# Patient Record
Sex: Female | Born: 1990 | Race: White | Hispanic: No | Marital: Single | Smoking: Never smoker
Health system: Southern US, Academic
[De-identification: ages and names within clinical notes are randomized; demographics above are authoritative.]

## PROBLEM LIST (undated history)

## (undated) DIAGNOSIS — E119 Type 2 diabetes mellitus without complications: Secondary | ICD-10-CM

## (undated) DIAGNOSIS — J45909 Unspecified asthma, uncomplicated: Secondary | ICD-10-CM

## (undated) DIAGNOSIS — E039 Hypothyroidism, unspecified: Secondary | ICD-10-CM

## (undated) DIAGNOSIS — I10 Essential (primary) hypertension: Secondary | ICD-10-CM

## (undated) DIAGNOSIS — E282 Polycystic ovarian syndrome: Secondary | ICD-10-CM

## (undated) DIAGNOSIS — F99 Mental disorder, not otherwise specified: Secondary | ICD-10-CM

## (undated) DIAGNOSIS — I471 Supraventricular tachycardia: Secondary | ICD-10-CM

## (undated) DIAGNOSIS — D682 Hereditary deficiency of other clotting factors: Secondary | ICD-10-CM

## (undated) HISTORY — DX: Hereditary deficiency of other clotting factors (CMS HCC): D68.2

## (undated) HISTORY — DX: Type 2 diabetes mellitus without complications (CMS HCC): E11.9

## (undated) HISTORY — DX: Unspecified asthma, uncomplicated: J45.909

## (undated) HISTORY — PX: HX GALL BLADDER SURGERY/CHOLE: SHX55

## (undated) HISTORY — DX: Supraventricular tachycardia (CMS HCC): I47.1

## (undated) HISTORY — DX: Hypothyroidism, unspecified: E03.9

## (undated) HISTORY — DX: Essential (primary) hypertension: I10

## (undated) HISTORY — DX: Polycystic ovarian syndrome: E28.2

## (undated) HISTORY — DX: Mental disorder, not otherwise specified: F99

## (undated) HISTORY — PX: WISDOM TOOTH EXTRACTION: SHX21

## (undated) HISTORY — DX: Type 2 diabetes mellitus without complications: E11.9

---
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
## 2014-06-13 HISTORY — PX: CHOLECYSTECTOMY: SHX55

## 2020-05-22 ENCOUNTER — Other Ambulatory Visit: Payer: Self-pay

## 2020-05-22 ENCOUNTER — Emergency Department (HOSPITAL_COMMUNITY): Payer: Medicare (Managed Care)

## 2020-05-22 ENCOUNTER — Emergency Department
Admission: AC | Admit: 2020-05-22 | Discharge: 2020-05-22 | Disposition: A | Discharge status: Home / Self Care | Payer: BC Managed Care – PPO | Source: Ambulatory Visit | Attending: Internal Medicine | Admitting: Internal Medicine

## 2020-05-22 DIAGNOSIS — I10 Essential (primary) hypertension: Secondary | ICD-10-CM

## 2020-05-22 DIAGNOSIS — E785 Hyperlipidemia, unspecified: Secondary | ICD-10-CM

## 2020-05-22 DIAGNOSIS — R55 Syncope and collapse: Secondary | ICD-10-CM | POA: Insufficient documentation

## 2020-05-22 DIAGNOSIS — Z20822 Contact with and (suspected) exposure to covid-19: Secondary | ICD-10-CM | POA: Insufficient documentation

## 2020-05-22 DIAGNOSIS — R079 Chest pain, unspecified: Secondary | ICD-10-CM

## 2020-05-22 DIAGNOSIS — R000 Tachycardia, unspecified: Secondary | ICD-10-CM

## 2020-05-22 DIAGNOSIS — E119 Type 2 diabetes mellitus without complications: Secondary | ICD-10-CM | POA: Insufficient documentation

## 2020-05-22 DIAGNOSIS — I471 Supraventricular tachycardia: Secondary | ICD-10-CM | POA: Insufficient documentation

## 2020-05-22 LAB — CBC
HCT: 42.6 % (ref 34.8–46.0)
HGB: 14.3 g/dL (ref 11.5–16.0)
MCH: 30.6 pg (ref 26.0–32.0)
MCHC: 33.6 g/dL (ref 31.0–35.5)
MCV: 91.2 fL (ref 78.0–100.0)
MPV: 10.7 fL (ref 8.7–12.5)
PLATELETS: 305 10*3/uL (ref 150–400)
RBC: 4.67 10*6/uL (ref 3.85–5.22)
RDW-CV: 12.1 % (ref 11.5–15.5)
WBC: 11.3 10*3/uL — ABNORMAL HIGH (ref 3.7–11.0)

## 2020-05-22 LAB — COMPREHENSIVE METABOLIC PANEL, NON-FASTING
ALBUMIN: 4.4 g/dL (ref 3.5–5.0)
ALKALINE PHOSPHATASE: 75 U/L (ref 40–110)
ALT (SGPT): 31 U/L — ABNORMAL HIGH (ref 8–22)
ANION GAP: 8 mmol/L (ref 4–13)
AST (SGOT): 22 U/L (ref 8–45)
BILIRUBIN TOTAL: 0.6 mg/dL (ref 0.3–1.3)
BUN/CREA RATIO: 14 (ref 6–22)
BUN: 12 mg/dL (ref 8–25)
CALCIUM: 9.8 mg/dL (ref 8.5–10.0)
CHLORIDE: 104 mmol/L (ref 96–111)
CO2 TOTAL: 24 mmol/L (ref 22–30)
CREATININE: 0.84 mg/dL (ref 0.60–1.05)
GLUCOSE: 314 mg/dL — ABNORMAL HIGH (ref 65–125)
POTASSIUM: 4.2 mmol/L (ref 3.5–5.1)
PROTEIN TOTAL: 8 g/dL (ref 6.4–8.3)
SODIUM: 136 mmol/L (ref 136–145)

## 2020-05-22 LAB — URINALYSIS, MACROSCOPIC
BILIRUBIN: NOT DETECTED mg/dL
BLOOD: NOT DETECTED mg/dL
KETONES: NOT DETECTED mg/dL
LEUKOCYTES: NOT DETECTED WBCs/uL
NITRITE: NOT DETECTED
PH: 6 (ref 5.0–8.0)
PROTEIN: NOT DETECTED mg/dL
SPECIFIC GRAVITY: 1.027 (ref 1.005–1.030)
UROBILINOGEN: NOT DETECTED mg/dL

## 2020-05-22 LAB — DRUG SCREEN, WITH CONFIRMATION, URINE
AMPHETAMINES, URINE: NEGATIVE
BARBITURATES URINE: NEGATIVE
BENZODIAZEPINES URINE: NEGATIVE
BUPRENORPHINE URINE: NEGATIVE
CANNABINOIDS URINE: NEGATIVE
COCAINE METABOLITES URINE: NEGATIVE
CREATININE RANDOM URINE: 89 mg/dL (ref 50–100)
ECSTASY/MDMA URINE: NEGATIVE
FENTANYL, RANDOM URINE: NEGATIVE
METHADONE URINE: NEGATIVE
OPIATES URINE (LOW CUTOFF): NEGATIVE
OXYCODONE URINE: NEGATIVE

## 2020-05-22 LAB — RESPIRATORY VIRUS PANEL

## 2020-05-22 LAB — ECG 12 LEAD
Atrial Rate: 93 {beats}/min
Calculated P Axis: 54 degrees
Calculated R Axis: 36 degrees
Calculated T Axis: 41 degrees
PR Interval: 136 ms
QRS Duration: 80 ms
QT Interval: 346 ms
QTC Calculation: 430 ms
Ventricular rate: 93 {beats}/min

## 2020-05-22 LAB — PTT (PARTIAL THROMBOPLASTIN TIME): APTT: 36.7 s (ref 26.0–39.0)

## 2020-05-22 LAB — PT/INR
INR: 1 (ref <=5.00)
PROTHROMBIN TIME: 11.8 s (ref 9.7–13.6)

## 2020-05-22 LAB — LDL CHOLESTEROL, DIRECT: LDL DIRECT: 174 mg/dL — ABNORMAL HIGH (ref <100)

## 2020-05-22 LAB — ETHANOL, SERUM: ETHANOL: NOT DETECTED

## 2020-05-22 LAB — MAGNESIUM: MAGNESIUM: 1.8 mg/dL (ref 1.8–2.6)

## 2020-05-22 LAB — TROPONIN-I: TROPONIN I: 9 ng/L (ref 7–30)

## 2020-05-22 LAB — HCG, URINE QUALITATIVE, PREGNANCY: HCG URINE QUALITATIVE: NOT DETECTED

## 2020-05-22 LAB — LACTIC ACID LEVEL W/ REFLEX FOR LEVEL >2.0: LACTIC ACID: 2.2 mmol/L (ref 0.5–2.2)

## 2020-05-22 LAB — THYROID STIMULATING HORMONE (SENSITIVE TSH): TSH: 7.448 u[IU]/mL — ABNORMAL HIGH (ref 0.430–3.550)

## 2020-05-22 MED ORDER — SODIUM CHLORIDE 0.9 % IV BOLUS
1000.0000 mL | INJECTION | Freq: Once | Status: AC
Start: 2020-05-22 — End: 2020-05-22
  Administered 2020-05-22: 0 mL via INTRAVENOUS
  Administered 2020-05-22: 1000 mL via INTRAVENOUS

## 2020-05-22 MED ORDER — SODIUM CHLORIDE 0.9 % (FLUSH) INJECTION SYRINGE
3.0000 mL | INJECTION | Freq: Three times a day (TID) | INTRAMUSCULAR | Status: DC
Start: 2020-05-22 — End: 2020-05-22

## 2020-05-22 MED ORDER — SODIUM CHLORIDE 0.9 % (FLUSH) INJECTION SYRINGE
3.0000 mL | INJECTION | INTRAMUSCULAR | Status: DC | PRN
Start: 2020-05-22 — End: 2020-05-22

## 2020-05-22 NOTE — ED Nurses Note (Signed)
Patient went to the bathroom using a wheelchair. Patient back in the room and back on the monitor at this time.

## 2020-05-22 NOTE — ED Provider Notes (Signed)
Emergency Department  Provider Note  HPI - 05/22/2020    COVID-19 PANDEMIC IN EFFECT    History and Physical Exam     Marie Vasquez 29 y.o. female  Date of Birth: 05/31/1991     Attending: Dr. Victorino Dike Iisha Soyars  Scribe: Suella Grove    PCP: Merlene Morse, MD      HPI:    Visit Reason:   Chief Complaint   Patient presents with    Stroke Alert         History provided by: Patient and Co-workers    Marie Vasquez is a 29 y.o. female presents to the ED today for stroke like symptoms.     Patient arrives to the ED via private vehicle. Co-worker reports patient was startled when she came up to her to say hello and said "that is one way to break my SVT". Co-worker note patient verbalized she "really just needed to sit down" and when she sat down started starring off into space and became non-verbal, less responsive. Co-worker reports patient is a diabetic and her blood sugar was 289 and notes patient has a history of SVT. 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 Nurses note patient was a dead lift and is unable to ambulate at this time. Patient reports she is having chest pains but is unable to communicate anything else.     Patient has a history is pseudoseizures with anxiety.     Patient has not had any recent known sick exposures. Patient denies having any fever, head pain, neck pain, back pain, cough, SOB, abdominal pain, n/v/d, or GU symptoms. Patient does not have any other associated s/s at this time.    There are no other complaints or concerns noted at this time.    Location: Neuro  Quality: Stroke like symptoms  Onset: Today  Timing: Still present  Context: See HPI  Modifying factors: None noted  Associated symptoms: (+) Stroke like symptoms - starring off, awake but unresponsive, non-verbal, unable to ambulate, chest pain (-) fever, head pain, neck pain, back pain, cough, SOB, abdominal pain, n/v/d, or GU symptoms    Review of Systems:    Constitutional: (-) fever,  chills  Skin: (-) rashes, lesions  HENT: (-) sore throat, ear pain, difficulty swallowing  Eyes: (-) vision changes, redness, discharge  Cardio: (+) Chest pain, (-) palpitations   Respiratory: (-) cough, wheezing, SOB  GI:  (-) nausea, vomiting, diarrhea, constipation, abdominal pain  GU:  (-) dysuria, hematuria, polyuria  MSK: (-) joint pain, neck pain, back pain  Neuro: (+) Stroke like symptoms - starring off, awake but unresponsive, non-verbal, unable to ambulate. No numbness, tingling, headache  Psych: (-) SI, HI, anxiety, depression.  All other systems reviewed and are negative, unless commented on in the HPI.     Past Medical History     PMHx:    Medical History          No past medical history on file.       Allergies:    Not on File  Social History  Social History     Tobacco Use    Smoking status: Not on file   Substance Use Topics    Alcohol use: Not on file    Drug use: Not on file     Family History  Family Medical History:    None        Home Meds:     Current Facility-Administered Medications:  NS flush syringe, 3 mL, Intracatheter, Q8HRS, Joey Lierman, Alessandra Bevels, DO    NS flush syringe, 3 mL, Intracatheter, Q1H PRN, Kassim Guertin, Alessandra Bevels, DO  No current outpatient medications on file.          Exam and Objective Findings     Nursing notes reviewed. Old records reviewed.    Filed Vitals:    05/22/20 1515 05/22/20 1600 05/22/20 1730 05/22/20 1745   BP: 131/86 126/70 (!) 140/93 131/83   Pulse: 97 93 99 94   Resp: 18 20 19 18    Temp:             Physical Exam  Nursing note and vitals reviewed.  Vital signs reviewed as above.     Constitutional: Panicking, following commands. Pt is awake, alert, and in no acute distress.   Skin: Warm and dry. No rash or lesions.  HENT: Atraumatic. Moist mucous membranes. No pharyngeal erythema. Normal TM's.  Eyes: Conjunctivae are normal. Pupils are equal, round, and reactive to light.  Cardiovascular: Regular rate. Normal rhythm. Distal pulses intact bilaterally. No leg  swelling.  Respiratory: Breath sounds normal. Effort normal. No respiratory distress.   GI/abdomen: Abdomen is soft, nontender, nondistended. Bowel sounds are normal. No rebound, guarding, or masses.   MSK/Extremities: Normal range of motion. No tenderness or redness.   Neurological: Patient is alert and oriented to person, place and time. Cranial nerves 2-12 are grossly intact.  Psychiatric: Patient has a normal mood and affect.     Work-up:  Orders Placed This Encounter    ADULT ROUTINE BLOOD CULTURE, SET OF 2 ADULT BOTTLES (BACTERIA AND YEAST)    ADULT ROUTINE BLOOD CULTURE, SET OF 2 ADULT BOTTLES (BACTERIA AND YEAST)    RESPIRATORY VIRUS PANEL    CANCELED: CT ANGIO INTRACRANIAL W IV CONTRAST    CANCELED: CT ANGIO CAROTID-EXTRACRANIAL (NECK) W IV CONTRAST    CT BRAIN WO IV CONTRAST    MOBILE CHEST X-RAY    CBC    COMPREHENSIVE METABOLIC PANEL, NON-FASTING    PTT (PARTIAL THROMBOPLASTIN TIME)    PT/INR    LDL CHOLESTEROL, DIRECT    TROPONIN-I    URINALYSIS, MACROSCOPIC    DRUG SCREEN, WITH CONFIRMATION, URINE    THYROID STIMULATING HORMONE (SENSITIVE TSH)    HCG, URINE QUALITATIVE, PREGNANCY    LACTIC ACID LEVEL W/ REFLEX FOR LEVEL >2.0    ETHANOL, SERUM    MAGNESIUM    LACTIC ACID TIMED    ECG 12 LEAD    PERFORM POC WHOLE BLOOD GLUCOSE    PERFORM POC ISTAT CREATININE POINT OF CARE    INSERT & MAINTAIN PERIPHERAL IV ACCESS    PERIPHERAL IV DRESSING CHANGE    NS flush syringe    NS flush syringe    NS bolus infusion 1,000 mL        Labs:  Results for orders placed or performed during the hospital encounter of 05/22/20 (from the past 24 hour(s))   RESPIRATORY VIRUS PANEL    Specimen: Nasopharyngeal Swab   Result Value Ref Range    ADENOVIRUS ARRAY Not Detected Not Detected    CORONAVIRUS 229E Not Detected Not Detected    CORONAVIRUS HKU1 Not Detected Not Detected    CORONAVIRUS NL63 Not Detected Not Detected    CORONAVIRUS OC43 Not Detected Not Detected    SARS CORONAVIRUS 2 (SARS-CoV-2)  Not Detected Not Detected    METAPNEUMOVIRUS ARRAY Not Detected Not Detected    RHINOVIRUS/ENTEROVIRUS ARRAY Not Detected Not Detected  INFLUENZA A Not Detected Not Detected    INFLUENZA B ARRAY Not Detected Not Detected    PARAINFLUENZA 1 ARRAY Not Detected Not Detected    PARAINFLUENZA 2 ARRAY Not Detected Not Detected    PARAINFLUENZA 3 ARRAY Not Detected Not Detected    PARAINFLUENZA 4 ARRAY Not Detected Not Detected    RSV ARRAY Not Detected Not Detected    BORDETELLA PARAPERTUSSIS (IS 1001) Not Detected Not Detected    BORDETELLA PERTUSSIS ARRAY Not Detected Not Detected    CHLAMYDOPHILA PNEUMONIAE ARRAY Not Detected Not Detected    MYCOPLASMA PNEUMONIAE ARRAY Not Detected Not Detected   CBC   Result Value Ref Range    WBC 11.3 (H) 3.7 - 11.0 x103/uL    RBC 4.67 3.85 - 5.22 x106/uL    HGB 14.3 11.5 - 16.0 g/dL    HCT 90.3 00.9 - 23.3 %    MCV 91.2 78.0 - 100.0 fL    MCH 30.6 26.0 - 32.0 pg    MCHC 33.6 31.0 - 35.5 g/dL    RDW-CV 00.7 62.2 - 63.3 %    PLATELETS 305 150 - 400 x103/uL    MPV 10.7 8.7 - 12.5 fL   COMPREHENSIVE METABOLIC PANEL, NON-FASTING   Result Value Ref Range    SODIUM 136 136 - 145 mmol/L    POTASSIUM 4.2 3.5 - 5.1 mmol/L    CHLORIDE 104 96 - 111 mmol/L    CO2 TOTAL 24 22 - 30 mmol/L    ANION GAP 8 4 - 13 mmol/L    BUN 12 8 - 25 mg/dL    CREATININE 3.54 5.62 - 1.05 mg/dL    BUN/CREA RATIO 14 6 - 22    ESTIMATED GFR      ALBUMIN 4.4 3.5 - 5.0 g/dL     CALCIUM 9.8 8.5 - 56.3 mg/dL    GLUCOSE 893 (H) 65 - 125 mg/dL    ALKALINE PHOSPHATASE 75 40 - 110 U/L    ALT (SGPT) 31 (H) 8 - 22 U/L    AST (SGOT)  22 8 - 45 U/L    BILIRUBIN TOTAL 0.6 0.3 - 1.3 mg/dL    PROTEIN TOTAL 8.0 6.4 - 8.3 g/dL    Narrative    Hemolysis can alter results at this level (slight).  Hemolysis can alter results at this level (slight).   PTT (PARTIAL THROMBOPLASTIN TIME)   Result Value Ref Range    APTT 36.7 26.0 - 39.0 seconds   PT/INR   Result Value Ref Range    PROTHROMBIN TIME 11.8 9.7 - 13.6 seconds    INR 1.00  <=5.00   LDL CHOLESTEROL, DIRECT   Result Value Ref Range    LDL DIRECT 174 (H) <100 mg/dL   TROPONIN-I   Result Value Ref Range    TROPONIN I 9 7 - 30 ng/L   URINALYSIS, MACROSCOPIC   Result Value Ref Range    COLOR Yellow Colorless, Straw, Yellow    APPEARANCE Clear Clear    SPECIFIC GRAVITY 1.027 >1.005-<1.030    PH 6.0 >5.0-<8.0    PROTEIN Not Detected Not Detected mg/dL    GLUCOSE 3+ (A) Not Detected mg/dL    KETONES Not Detected Not Detected mg/dL    UROBILINOGEN Not Detected Not Detected mg/dL    BILIRUBIN Not Detected Not Detected mg/dL    BLOOD Not Detected Not Detected mg/dL    NITRITE Not Detected Not Detected    LEUKOCYTES Not Detected Not Detected WBCs/uL  DRUG SCREEN, WITH CONFIRMATION, URINE   Result Value Ref Range    AMPHETAMINES, URINE Negative Negative    BARBITURATES URINE Negative Negative    BENZODIAZEPINES URINE Negative Negative    BUPRENORPHINE URINE Negative Negative    CANNABINOIDS URINE Negative Negative    COCAINE METABOLITES URINE Negative Negative    METHADONE URINE Negative Negative    OPIATES URINE (LOW CUTOFF) Negative Negative    OXYCODONE URINE Negative Negative    ECSTASY/MDMA URINE Negative Negative    FENTANYL, RANDOM URINE Negative Negative    CREATININE RANDOM URINE 89 50 - 100 mg/dL   THYROID STIMULATING HORMONE (SENSITIVE TSH)   Result Value Ref Range    TSH 7.448 (H) 0.430 - 3.550 uIU/mL   HCG, URINE QUALITATIVE, PREGNANCY   Result Value Ref Range    HCG URINE QUALITATIVE Not detected Not detected   LACTIC ACID LEVEL W/ REFLEX FOR LEVEL >2.0   Result Value Ref Range    LACTIC ACID 2.2 0.5 - 2.2 mmol/L   ETHANOL, SERUM   Result Value Ref Range    ETHANOL None Detected    MAGNESIUM   Result Value Ref Range    MAGNESIUM 1.8 1.8 - 2.6 mg/dL    Narrative    Hemolysis can alter results at this level (slight).       Imaging:   Results for orders placed or performed during the hospital encounter of 05/22/20 (from the past 72 hour(s))   CT BRAIN WO IV CONTRAST     Status: None     Narrative    Exam:Lethargy and possible CVA    Clinical history:Lethargy with possible CVA in this diabetic patient with hyperglycemia.    Total DLP1107mG y*cm    This CT scanner is equipped with dose reducing technology which automatically reduces the MAS according to patient size to achieve the lowest radiation exposure possible.      Axial noncontrast 5 mm images were obtained through the brain and viewed with brain and bone windows. In addition, sagittal and coronal reconstructions were completed.    The ventricles are normal in size and position. There is no evidence of intracranial hemorrhage, midline shift, hydrocephalus, or acute ischemia in a major vascular territory. Bone windows demonstrate no calvarial fracture.      Impression    1. Normal noncontrast head CT.  2. No acute intracranial pathology.        Radiologist location ID: [id number]     MOBILE CHEST X-RAY     Status: None    Narrative    EXAMINATION: Chest AP portable    HISTORY: Altered mental status. Stroke alert.      AP portable view of the chest was obtained. Comparison made to an exam dated 03/08/2017 from [facility].    Heart size is normal. No acute pulmonary infiltrates are seen. There are no pleural effusions. No parenchymal masses are identified.      Impression    1. No evidence of acute cardiopulmonary disease.      Radiologist location ID: [id number]         Abnormal Lab results:  Labs Reviewed   CBC - Abnormal; Notable for the following components:       Result Value    WBC 11.3 (*)     All other components within normal limits   COMPREHENSIVE METABOLIC PANEL, NON-FASTING - Abnormal; Notable for the following components:    GLUCOSE 314 (*)     ALT (SGPT) 31 (*)  All other components within normal limits    Narrative:     Hemolysis can alter results at this level (slight).  Hemolysis can alter results at this level (slight).   LDL CHOLESTEROL, DIRECT - Abnormal; Notable for the following components:    LDL DIRECT 174 (*)      All other components within normal limits   URINALYSIS, MACROSCOPIC - Abnormal; Notable for the following components:    GLUCOSE 3+ (*)     All other components within normal limits   THYROID STIMULATING HORMONE (SENSITIVE TSH) - Abnormal; Notable for the following components:    TSH 7.448 (*)     All other components within normal limits   RESPIRATORY VIRUS PANEL - Normal   PTT (PARTIAL THROMBOPLASTIN TIME) - Normal   PT/INR - Normal   TROPONIN-I - Normal   DRUG SCREEN, WITH CONFIRMATION, URINE - Normal   HCG, URINE QUALITATIVE, PREGNANCY - Normal   LACTIC ACID LEVEL W/ REFLEX FOR LEVEL >2.0 - Normal   MAGNESIUM - Normal    Narrative:     Hemolysis can alter results at this level (slight).   ADULT ROUTINE BLOOD CULTURE, SET OF 2 BOTTLES (BACTERIA AND YEAST)   ADULT ROUTINE BLOOD CULTURE, SET OF 2 BOTTLES (BACTERIA AND YEAST)   ETHANOL, SERUM   LACTIC ACID TIMED   PERFORM POC WHOLE BLOOD GLUCOSE   PERFORM POC ISTAT CREATININE POINT OF CARE       EKG's     Most Recent EKG This Encounter   ECG 12 LEAD    Collection Time: 05/22/20  3:08 PM   Result Value    Ventricular rate 93    Atrial Rate 93    PR Interval 136    QRS Duration 80    QT Interval 346    QTC Calculation 430    Calculated P Axis 54    Calculated R Axis 36    Calculated T Axis 41    Narrative    Normal sinus rhythm  Normal ECG     REVIEWED BY DR. Yong ChannelJENNIFER Annalissa Murphey   @ 1508  No stemi  Confirmed by Yong ChannelAUXIER, Lily Kernen ([id number]), editor Suella GroveParsons, Mika [phone]) on 05/22/2020 5:40:55 PM     Assessment & Plan     Plan: Appropriate labs and imaging ordered. Medical Records reviewed.    MDM:   During the patient's stay in the emergency department, the above listed information was used to assist with medical decision making and were reviewed by myself when available for review.  ED Course     ED Course as of May 22 1816   Fri May 22, 2020   1814 Pt reports improvement.  She has a doctors ppt tomorrow and prefers to discuss titration of dm meds and synthroid with her PCP.     [JA]      ED Course User Index  [JA] Dexter Sauser, Alessandra BevelsJennifer M, DO       Patient remained stable throughout the emergency department course.      Consults:    Dr. Hunt OrisBokka at bedside    Disposition   Impression:   Diagnoses     Diagnosis Comment Added By Time Added    Tachycardia  Talmadge ChadAuxier, Khristopher Kapaun M, DO 05/22/2020  6:17 PM    Syncope  Harvin Konicek, Alessandra BevelsJennifer M, DO 05/22/2020  6:17 PM        Disposition:  Discharged     It was advised that the patient return to the ED with any new, concerning  or worsening symptoms and follow up as directed.    The patient verbalized understanding of all instructions and had no further questions or concerns.     Follow up:   Merlene Morse, MD  [address]  [address]  [place] [address]  [phone]    Schedule an appointment as soon as possible for a visit         I am scribing for, and in the presence of, Dr. Yong Channel for services provided on 05/22/2020.  Suella Grove, SCRIBE     Suella Grove, SCRIBE  05/22/2020, 14:56        I personally performed the services described in this documentation, as scribed  in my presence, and it is both accurate  and complete.    Alessandra Bevels Mariateresa Batra, DO  Alessandra Bevels Leith Hedlund, DO  05/25/20

## 2020-05-22 NOTE — Consults (Signed)
[facility]  Neurology Consult Note    05/22/2020       Name:  Marie Vasquez, Marie Vasquez  Age: 29 y.o.  Gender: female  Date of Admission:  05/22/2020  Date of Birth:  [birth year]    Code Status:   Code Status Information     Code Status Advance Care Planning    Not on file Jump to the Activity           PCP: No primary care provider on file.  Consulting physician:     Chief Complaint:  Unresponsiveness  Reason for the consult:  Stroke alert  History obtained from:  Patient on her sister    Last known normal:  2:30 p.m. on 05/22/2020  IV thrombolysis:  Not done due to low NIHSS  NIHSS:  0    Marie Vasquez is a 29 y.o., Data Unavailable female with PMH of factor deficiency, type 2 diabetes mellitus, hypertension, hyperlipidemia, PNES and supraventricular tachycardia brought in for the evaluation of unresponsiveness.  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  Patient reports that she has having symptoms of palpitations, mild shortness of breath and lightheadedness for which she went to break room where she was noted to have slumped over for which stroke alert was called.  On my evaluation patient is alert, awake and slow to respond but answering all of my questions without any focal neurological deficits.  Patient does have similar episodes of unresponsiveness with underlying event of supraventricular tachycardia in the past for which she takes extra dose of atenolol.  Patient does have staring episodes for which she was evaluated in epilepsy monitoring unit and was diagnosed with pseudoseizures.  She denies any focal weakness, numbness, slurred speech, facial droop, vision problems.  Patient does take aspirin at home.  Patient lives independently and does not need any assistance for ADLs.    Review of Systems:  Constitutional: Denies weight loss, fever and chills.  Eyes: Denies any changes in vision  HENT: Denies changes in hearing and ear  discharge  Respiratory: Denies SOB and cough  CV: Denies palpitations and CP.  GI: Denies abdominal pain, nausea, vomiting and diarrhea  GU: Denies dysuria and urinary frequency  MSK: Denies myalgia and joint pain  Skin: Denies rash and pruritus.  Neurological: As per HPI  Psychiatric: Denies recent changes in mood. Denies anxiety and depression.    Past Medical History:  Type 2 diabetes mellitus, supraventricular tachycardia, factor father home, hypertension, hyperlipidemia      Past Surgical History:  None  No past surgical history on file.    Past Social Hisory:  Drinks socially        Past Family History:  Father had factor deficiency  Family Medical History:    None         Allergies:  None  Not on File     Medications:  Takes aspirin at home  .  NS bolus infusion 1,000 mL 1,000 mL Once  .  NS flush syringe 3 mL Q8HRS      Vital Signs:  Filed Vitals:    05/22/20 1453 05/22/20 1454   BP:  (!) 152/91   Pulse: (!) 122 (!) 117   Resp: (!) 23 (!) 21        Physical exam:   General: No acute distress. Well-nourished.  Eyes: EOMI, no nystagmus  Lungs: No labored breathing, no wheezing  Cardiovascular: Regular radial pulses felt  Abdomen:  Soft, non-tender and non-distended.   Extremities: No edema. Non-tender.  Skin: No rashes or lesions.  Psychiatric: Cooperative. Appropriate mood and affect.    Neurological:  Mental status: Alert, awake, oriented X3, attentive, intact memory, good fund of knowledge, clear speech  Cranial nerves: Visual fields full, EOMI, PERRLA, intact facial sensations, face symmetric, intact hearing, intact gag, tongue midline, normal shoulder shrug.  Motor: Normal tone, 5/5 strength in all extremities.  Sensory: Intact sensation to pain/temperature and light touch  Reflexes: 2+ throughout. Plantar reflexes downgoing bilaterally.  Coordination: No dysmetria on FNT  Gait: Deferred to due to medical condition.      Assessment and Recommendations:    Marie Vasquez is a 29 y.o., Data  Unavailable female with PMH of factor deficiency, type 2 diabetes mellitus, hypertension, hyperlipidemia, PNES and supraventricular tachycardia brought in for the evaluation of unresponsiveness.    Labs: I personally reviewed all the lab results.       Neuroimaging: I personally reviewed the images and interpretated the results.    CT head 05/22/2020:  There is no acute intracranial findings noticed.    1. Unresponsiveness-resolved  Likely due to underlying supraventricular tachycardia versus anxiety.  Patient does not have any focal neurologic deficits on my exam and is less likely vascular event of the brain.  I would recommend checking for EKG, blood sugar and serum electrolytes.  IV normal saline 1 L bolus.  No further workup needed from Neurology standpoint.    I have discussed about my recommendations and plan with the patient.  She verbalized understanding to all of her questions were answered.    Thanks for the consult.  Call us with questions.        Judie Petit, MD  05/22/2020, 15:08     This note was partially generated using MModal Fluency Direct system, and there may be some incorrect words, spellings, and punctuation.

## 2020-05-22 NOTE — ED Triage Notes (Signed)
Patient arrived as a stroke alert. Patient is a Engineer, civil (consulting) on the floor. Coworkers report that she had told them she was not feeling well. Next time seen was altered. Hx of diabetes and SVT. BS 269

## 2020-05-22 NOTE — Ancillary Notes (Signed)
Stroke alert paged at 1444

## 2020-05-22 NOTE — ED Nurses Note (Signed)
Patient awake alert and oriented. Provider notified

## 2020-05-23 ENCOUNTER — Encounter (HOSPITAL_COMMUNITY): Payer: Self-pay

## 2020-05-23 ENCOUNTER — Other Ambulatory Visit: Payer: Self-pay

## 2020-05-23 ENCOUNTER — Emergency Department (HOSPITAL_COMMUNITY): Payer: Auto Insurance (includes no fault)

## 2020-05-23 ENCOUNTER — Emergency Department
Admission: EM | Admit: 2020-05-23 | Discharge: 2020-05-23 | Disposition: A | Discharge status: Home / Self Care | Payer: Auto Insurance (includes no fault) | Attending: Emergency Medicine | Admitting: Emergency Medicine

## 2020-05-23 ENCOUNTER — Emergency Department: Payer: Auto Insurance (includes no fault) | Attending: Emergency Medicine

## 2020-05-23 DIAGNOSIS — M25512 Pain in left shoulder: Secondary | ICD-10-CM | POA: Insufficient documentation

## 2020-05-23 DIAGNOSIS — R202 Paresthesia of skin: Secondary | ICD-10-CM | POA: Insufficient documentation

## 2020-05-23 DIAGNOSIS — Y92410 Unspecified street and highway as the place of occurrence of the external cause: Secondary | ICD-10-CM

## 2020-05-23 LAB — COMPREHENSIVE METABOLIC PANEL, NON-FASTING
ALBUMIN: 4.1 g/dL (ref 3.5–5.0)
ALKALINE PHOSPHATASE: 67 U/L (ref 40–110)
ALT (SGPT): 28 U/L — ABNORMAL HIGH (ref 8–22)
ANION GAP: 8 mmol/L (ref 4–13)
AST (SGOT): 18 U/L (ref 8–45)
BILIRUBIN TOTAL: 0.5 mg/dL (ref 0.3–1.3)
BUN/CREA RATIO: 11 (ref 6–22)
BUN: 9 mg/dL (ref 8–25)
CALCIUM: 9.4 mg/dL (ref 8.5–10.0)
CHLORIDE: 105 mmol/L (ref 96–111)
CO2 TOTAL: 22 mmol/L (ref 22–30)
CREATININE: 0.8 mg/dL (ref 0.60–1.05)
ESTIMATED GFR: >90 mL/min/BSA (ref >=60)
GLUCOSE: 291 mg/dL — ABNORMAL HIGH (ref 65–125)
POTASSIUM: 4.3 mmol/L (ref 3.5–5.1)
PROTEIN TOTAL: 7.4 g/dL (ref 6.4–8.3)
SODIUM: 135 mmol/L — ABNORMAL LOW (ref 136–145)

## 2020-05-23 LAB — CBC WITH DIFF
BASOPHIL #: <0.1 10*3/uL (ref <=0.20)
BASOPHIL %: 1 %
EOSINOPHIL #: 0.22 10*3/uL (ref <=0.50)
EOSINOPHIL %: 2 %
HCT: 42 % (ref 34.8–46.0)
HGB: 14.2 g/dL (ref 11.5–16.0)
IMMATURE GRANULOCYTE #: <0.1 10*3/uL (ref <0.10)
IMMATURE GRANULOCYTE %: 0 % (ref 0–1)
LYMPHOCYTE #: 2.93 10*3/uL (ref 1.00–4.80)
LYMPHOCYTE %: 30 %
MCH: 30.9 pg (ref 26.0–32.0)
MCHC: 33.8 g/dL (ref 31.0–35.5)
MCV: 91.5 fL (ref 78.0–100.0)
MONOCYTE #: 0.6 10*3/uL (ref 0.20–1.10)
MONOCYTE %: 6 %
MPV: 10.6 fL (ref 8.7–12.5)
NEUTROPHIL #: 5.79 10*3/uL (ref 1.50–7.70)
NEUTROPHIL %: 61 %
PLATELETS: 293 10*3/uL (ref 150–400)
RBC: 4.59 10*6/uL (ref 3.85–5.22)
RDW-CV: 12.1 % (ref 11.5–15.5)
WBC: 9.6 10*3/uL (ref 3.7–11.0)

## 2020-05-23 MED ORDER — TRAMADOL 37.5 MG-ACETAMINOPHEN 325 MG TABLET
1.0000 | ORAL_TABLET | Freq: Four times a day (QID) | ORAL | 0 refills | Status: AC | PRN
Start: 2020-05-23 — End: 2020-05-28

## 2020-05-23 MED ORDER — KETOROLAC 30 MG/ML (1 ML) INJECTION SOLUTION
30.0000 mg | INTRAMUSCULAR | Status: AC
Start: 2020-05-23 — End: 2020-05-23
  Administered 2020-05-23: 30 mg via INTRAVENOUS
  Filled 2020-05-23: qty 1

## 2020-05-23 MED ORDER — ONDANSETRON HCL (PF) 4 MG/2 ML INJECTION SOLUTION
4.0000 mg | INTRAMUSCULAR | Status: AC
Start: 2020-05-23 — End: 2020-05-23
  Administered 2020-05-23: 4 mg via INTRAVENOUS
  Filled 2020-05-23: qty 2

## 2020-05-23 MED ORDER — SODIUM CHLORIDE 0.9 % (FLUSH) INJECTION SYRINGE
3.0000 mL | INJECTION | Freq: Three times a day (TID) | INTRAMUSCULAR | Status: DC
Start: 2020-05-23 — End: 2020-05-23

## 2020-05-23 MED ORDER — SODIUM CHLORIDE 0.9 % (FLUSH) INJECTION SYRINGE
3.0000 mL | INJECTION | INTRAMUSCULAR | Status: DC | PRN
Start: 2020-05-23 — End: 2020-05-23
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
## 2020-05-23 NOTE — Discharge Instructions (Signed)
Wear sling to unload you shoulder.  However, do range of motion 2 times a day to reduce the likelihood of frozen shoulder syndrome    Tramadol as necessary for pain no working or driving while taking tramadol    Return here any new or worsening symptoms

## 2020-05-27 LAB — ADULT ROUTINE BLOOD CULTURE, SET OF 2 BOTTLES (BACTERIA AND YEAST): BLOOD CULTURE, ROUTINE: NO GROWTH

## 2020-05-28 LAB — ADULT ROUTINE BLOOD CULTURE, SET OF 2 BOTTLES (BACTERIA AND YEAST): BLOOD CULTURE, ROUTINE: NO GROWTH

## 2020-06-24 ENCOUNTER — Ambulatory Visit: Payer: Medicare (Managed Care) | Attending: PREVENTIVE MEDICINE-OCCUPATIONAL MEDICINE

## 2020-06-24 ENCOUNTER — Ambulatory Visit (HOSPITAL_COMMUNITY): Payer: Self-pay

## 2020-06-24 DIAGNOSIS — Z20822 Contact with and (suspected) exposure to covid-19: Secondary | ICD-10-CM | POA: Insufficient documentation

## 2020-06-24 DIAGNOSIS — Z20828 Contact with and (suspected) exposure to other viral communicable diseases: Secondary | ICD-10-CM

## 2020-06-24 DIAGNOSIS — R509 Fever, unspecified: Secondary | ICD-10-CM

## 2020-06-24 LAB — COVID-19 ~~LOC~~ MOLECULAR LAB TESTING: SARS-COV-2: NOT DETECTED

## 2020-06-24 NOTE — Progress Notes (Signed)
COVID-19 SCREENING NOTE    Primary Care Provider: Merlene Morse, MD     Has the patient had recent travel to/from or exposure to someone with recent travel to a COVID-19 "hot spot"? 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 Yes  Has the patient had cough? Yes  Has the patient had shortness of breath? Yes  Additional symptoms? congestion     There is no problem list on file for this patient.        Occupation: Data Unavailable    Disposition:   SCREENING INDICATED.  Order to be placed. SCREENING SITE: [place] [facility].  Patient phone number and address confirmed: [phone], [address]  [place] [address].      Verbal consent obtained from the patient to be contacted with result.    Y[place] Pacella A. Melvyn Neth, RN

## 2020-06-24 NOTE — Patient Instructions (Signed)
Thank you for visiting our drive through Coronavirus testing site.     You or your family member is a Person Under Investigation (called a PUI). As a PUI you are considered contagious, until the swab comes back telling you/us that you do not have Covid-19.    Here is what you must do now:  . Return home, no stops to shop or social events;  . Stay at home in isolation;  . No visitors to your home;  . Call you provider if you are worse to ask for guidance.    Results:  . We anticipate your result will be available in 7 days.  . Results will be sent to [email or web address].  If you do not have access to [email or web address], please go here to sign-up: [email or web address]  . Someone from our team will contact you when your result is available.  . You will be given further instructions about how long to stay in isolation at that time.    For the most current information on Coronavirus (COVID-19), please check these sites:  [facility]:  [email or web address]    Centers for Disease Control:  https://www.cdc.gov/coronavirus/2019-nCoV/index.html

## 2020-06-25 ENCOUNTER — Other Ambulatory Visit: Payer: Self-pay

## 2020-08-31 ENCOUNTER — Emergency Department (EMERGENCY_DEPARTMENT_HOSPITAL): Payer: Medicare (Managed Care)

## 2020-08-31 ENCOUNTER — Inpatient Hospital Stay
Admission: EM | Admit: 2020-08-31 | Discharge: 2020-09-02 | DRG: 101 | Disposition: A | Discharge status: Home / Self Care | Payer: Medicare (Managed Care) | Attending: Neurology | Admitting: Neurology

## 2020-08-31 ENCOUNTER — Encounter (HOSPITAL_COMMUNITY): Payer: Self-pay

## 2020-08-31 ENCOUNTER — Emergency Department (EMERGENCY_DEPARTMENT_HOSPITAL): Payer: Medicare (Managed Care) | Admitting: Radiology

## 2020-08-31 ENCOUNTER — Other Ambulatory Visit: Payer: Self-pay

## 2020-08-31 DIAGNOSIS — R569 Unspecified convulsions: Principal | ICD-10-CM | POA: Diagnosis present

## 2020-08-31 DIAGNOSIS — F329 Major depressive disorder, single episode, unspecified: Secondary | ICD-10-CM | POA: Diagnosis present

## 2020-08-31 DIAGNOSIS — Z7984 Long term (current) use of oral hypoglycemic drugs: Secondary | ICD-10-CM

## 2020-08-31 DIAGNOSIS — R0602 Shortness of breath: Secondary | ICD-10-CM

## 2020-08-31 DIAGNOSIS — E039 Hypothyroidism, unspecified: Secondary | ICD-10-CM | POA: Diagnosis present

## 2020-08-31 DIAGNOSIS — E119 Type 2 diabetes mellitus without complications: Secondary | ICD-10-CM | POA: Diagnosis present

## 2020-08-31 DIAGNOSIS — Z79890 Hormone replacement therapy (postmenopausal): Secondary | ICD-10-CM

## 2020-08-31 DIAGNOSIS — R0902 Hypoxemia: Secondary | ICD-10-CM | POA: Diagnosis present

## 2020-08-31 DIAGNOSIS — R002 Palpitations: Secondary | ICD-10-CM | POA: Diagnosis present

## 2020-08-31 DIAGNOSIS — I10 Essential (primary) hypertension: Secondary | ICD-10-CM | POA: Diagnosis present

## 2020-08-31 DIAGNOSIS — E282 Polycystic ovarian syndrome: Secondary | ICD-10-CM | POA: Diagnosis present

## 2020-08-31 DIAGNOSIS — Z79899 Other long term (current) drug therapy: Secondary | ICD-10-CM

## 2020-08-31 DIAGNOSIS — R9431 Abnormal electrocardiogram [ECG] [EKG]: Secondary | ICD-10-CM

## 2020-08-31 DIAGNOSIS — F411 Generalized anxiety disorder: Secondary | ICD-10-CM | POA: Diagnosis present

## 2020-08-31 DIAGNOSIS — R0789 Other chest pain: Secondary | ICD-10-CM | POA: Diagnosis present

## 2020-08-31 DIAGNOSIS — J45909 Unspecified asthma, uncomplicated: Secondary | ICD-10-CM | POA: Diagnosis present

## 2020-08-31 DIAGNOSIS — I471 Supraventricular tachycardia: Secondary | ICD-10-CM | POA: Diagnosis present

## 2020-08-31 DIAGNOSIS — D682 Hereditary deficiency of other clotting factors: Secondary | ICD-10-CM | POA: Diagnosis present

## 2020-08-31 DIAGNOSIS — R000 Tachycardia, unspecified: Secondary | ICD-10-CM

## 2020-08-31 LAB — VENOUS BLOOD GAS/LACTATE/LYTES (NA/K/CA/CL/GLUC)
%FIO2 (VENOUS): 21 %
%FIO2 (VENOUS): 21 %
BASE DEFICIT: 0.3 mmol/L (ref <=3.0)
BASE DEFICIT: 2 mmol/L (ref <=3.0)
BICARBONATE (VENOUS): 24.3 mmol/L (ref 22.0–26.0)
BICARBONATE (VENOUS): 22.5 mmol/L (ref 22.0–26.0)
CHLORIDE: 102 mmol/L (ref 101–111)
CHLORIDE: 104 mmol/L (ref 101–111)
GLUCOSE: 230 mg/dL — ABNORMAL HIGH (ref 60–105)
GLUCOSE: 194 mg/dL — ABNORMAL HIGH (ref 60–105)
IONIZED CALCIUM: 1.17 mmol/L (ref 1.10–1.35)
IONIZED CALCIUM: 1.14 mmol/L (ref 1.10–1.35)
LACTATE: 1.9 mmol/L — ABNORMAL HIGH (ref 0.0–1.3)
LACTATE: 2 mmol/L — ABNORMAL HIGH (ref 0.0–1.3)
PCO2 (VENOUS): 30 mm/Hg — ABNORMAL LOW (ref 41.00–51.00)
PCO2 (VENOUS): 28 mm/Hg — ABNORMAL LOW (ref 41.00–51.00)
PH (VENOUS): 7.48 — ABNORMAL HIGH (ref 7.31–7.41)
PH (VENOUS): 7.47 — ABNORMAL HIGH (ref 7.31–7.41)
PO2 (VENOUS): 42 mm/Hg (ref 35.0–50.0)
PO2 (VENOUS): 31 mm/Hg — ABNORMAL LOW (ref 35.0–50.0)
SODIUM: 134 mmol/L — ABNORMAL LOW (ref 137–145)
SODIUM: 134 mmol/L — ABNORMAL LOW (ref 137–145)
WHOLE BLOOD POTASSIUM: 3.7 mmol/L (ref 3.5–4.6)
WHOLE BLOOD POTASSIUM: 3.5 mmol/L (ref 3.5–4.6)

## 2020-08-31 LAB — CBC WITH DIFF
BASOPHIL #: 0.04 10*3/uL (ref <=0.20)
BASOPHIL %: 0 %
EOSINOPHIL #: 0.22 10*3/uL (ref <=0.50)
EOSINOPHIL %: 2 %
HCT: 48.1 % — ABNORMAL HIGH (ref 34.8–46.0)
HGB: 15.6 g/dL (ref 11.5–16.0)
IMMATURE GRANULOCYTE #: <0.04 10*3/uL (ref <0.10)
IMMATURE GRANULOCYTE %: 0 % (ref 0–1)
LYMPHOCYTE #: 4.22 10*3/uL (ref 1.00–4.80)
LYMPHOCYTE %: 39 %
MCH: 30 pg (ref 26.0–32.0)
MCHC: 32.4 g/dL (ref 31.0–35.5)
MCV: 92.5 fL (ref 78.0–100.0)
MONOCYTE #: 0.64 10*3/uL (ref 0.20–1.10)
MONOCYTE %: 6 %
MPV: 10.3 fL (ref 8.7–12.5)
NEUTROPHIL #: 5.79 10*3/uL (ref 1.50–7.70)
NEUTROPHIL %: 53 %
PLATELETS: 310 10*3/uL (ref 150–400)
RBC: 5.2 10*6/uL (ref 3.85–5.22)
RDW-CV: 12.3 % (ref 11.5–15.5)
WBC: 10.9 10*3/uL (ref 3.7–11.0)

## 2020-08-31 LAB — PTT (PARTIAL THROMBOPLASTIN TIME): APTT: 35.9 seconds (ref 24.2–37.5)

## 2020-08-31 LAB — URINALYSIS, MACROSCOPIC
BILIRUBIN: NEGATIVE mg/dL
COLOR: NORMAL
GLUCOSE: >=500 mg/dL — AB
KETONES: NEGATIVE mg/dL
NITRITE: NEGATIVE
PH: 5 (ref 5.0–8.0)
PROTEIN: NEGATIVE mg/dL
SPECIFIC GRAVITY: 1.026 (ref 1.005–1.030)
UROBILINOGEN: NEGATIVE mg/dL

## 2020-08-31 LAB — URINALYSIS, MICROSCOPIC
RBCS: 17 /hpf — ABNORMAL HIGH (ref <6.0)
WBCS: 6 /hpf (ref <11.0)

## 2020-08-31 LAB — ECG 12 LEAD
Atrial Rate: 88 {beats}/min
Calculated P Axis: 28 degrees
Calculated R Axis: 31 degrees
Calculated T Axis: 23 degrees
PR Interval: 132 ms
QRS Duration: 78 ms
QT Interval: 358 ms
QTC Calculation: 433 ms
Ventricular rate: 88 {beats}/min

## 2020-08-31 LAB — POC BLOOD GLUCOSE (RESULTS)
GLUCOSE, POC: 128 mg/dl — ABNORMAL HIGH (ref 70–105)
GLUCOSE, POC: 134 mg/dl — ABNORMAL HIGH (ref 70–105)

## 2020-08-31 LAB — COVID-19 ~~LOC~~ MOLECULAR LAB TESTING
INFLUENZA VIRUS TYPE A: NOT DETECTED
INFLUENZA VIRUS TYPE B: NOT DETECTED
RESPIRATORY SYNCTIAL VIRUS (RSV): NOT DETECTED
SARS-CoV-2: NOT DETECTED

## 2020-08-31 LAB — HEPATIC FUNCTION PANEL
ALBUMIN: 4.7 g/dL (ref 3.5–5.0)
ALKALINE PHOSPHATASE: 82 U/L (ref 40–110)
ALT (SGPT): 31 U/L (ref <55)
AST (SGOT): 18 U/L (ref 8–41)
BILIRUBIN DIRECT: 0.2 mg/dL (ref <0.3)
BILIRUBIN TOTAL: 0.6 mg/dL (ref 0.3–1.3)
PROTEIN TOTAL: 8.3 g/dL — ABNORMAL HIGH (ref 6.0–7.9)

## 2020-08-31 LAB — BASIC METABOLIC PANEL
ANION GAP: 13 mmol/L (ref 4–13)
BUN/CREA RATIO: 16 (ref 6–22)
BUN: 13 mg/dL (ref 8–25)
CALCIUM: 9.8 mg/dL (ref 8.5–10.2)
CHLORIDE: 101 mmol/L (ref 96–111)
CO2 TOTAL: 21 mmol/L — ABNORMAL LOW (ref 22–32)
CREATININE: 0.81 mg/dL (ref 0.49–1.10)
ESTIMATED GFR: >60 mL/min/{1.73_m2} (ref >60)
GLUCOSE: 215 mg/dL — ABNORMAL HIGH (ref 65–125)
POTASSIUM: 3.6 mmol/L (ref 3.5–5.1)
SODIUM: 135 mmol/L — ABNORMAL LOW (ref 136–145)

## 2020-08-31 LAB — TROPONIN-I (FOR ED ONLY): TROPONIN I: 8 ng/L (ref 0–30)

## 2020-08-31 LAB — THYROID STIMULATING HORMONE WITH FREE T4 REFLEX: TSH: 21.417 u[IU]/mL — ABNORMAL HIGH (ref 0.350–5.000)

## 2020-08-31 LAB — PT/INR
INR: 0.93 (ref 0.80–1.20)
PROTHROMBIN TIME: 10.7 seconds (ref 9.1–13.9)

## 2020-08-31 LAB — THYROXINE, FREE (FREE T4): THYROXINE (T4), FREE: 0.82 ng/dL (ref 0.70–1.25)

## 2020-08-31 LAB — LIPASE: LIPASE: 38 U/L (ref 10–80)

## 2020-08-31 LAB — HCG, PLASMA OR SERUM QUANTITATIVE, PREGNANCY: HCG QUANTITATIVE PREGNANCY: <1 IU/L (ref <5)

## 2020-08-31 LAB — D-DIMER: D-DIMER: <200 ng/mL DDU (ref <=232)

## 2020-08-31 MED ORDER — LORAZEPAM 2 MG/ML INJECTION SOLUTION
INTRAMUSCULAR | Status: AC
Start: 2020-08-31 — End: 2020-09-01
  Administered 2020-08-31: 09:00:00 0 mg
  Filled 2020-08-31: qty 1

## 2020-08-31 MED ORDER — SODIUM CHLORIDE 0.9 % IV BOLUS
1000.0000 mL | INJECTION | Status: AC
Start: 2020-08-31 — End: 2020-08-31
  Administered 2020-08-31: 08:00:00 1000 mL via INTRAVENOUS
  Administered 2020-08-31: 08:00:00 0 mL via INTRAVENOUS

## 2020-08-31 MED ORDER — ESCITALOPRAM 20 MG TABLET
20.0000 mg | ORAL_TABLET | Freq: Every day | ORAL | Status: DC
Start: 2020-09-01 — End: 2020-08-31

## 2020-08-31 MED ORDER — ENOXAPARIN 40 MG/0.4 ML SUBCUTANEOUS SYRINGE
40.0000 mg | INJECTION | SUBCUTANEOUS | Status: DC
Start: 2020-08-31 — End: 2020-09-02
  Administered 2020-08-31 – 2020-09-01 (×2): 40 mg via SUBCUTANEOUS
  Filled 2020-08-31 (×2): qty 0.4

## 2020-08-31 MED ORDER — ESCITALOPRAM 20 MG TABLET
20.0000 mg | ORAL_TABLET | Freq: Every evening | ORAL | Status: DC
Start: 2020-08-31 — End: 2020-09-02
  Administered 2020-08-31 – 2020-09-01 (×2): 20 mg via ORAL
  Filled 2020-08-31 (×2): qty 1

## 2020-08-31 MED ORDER — SENNOSIDES 8.6 MG-DOCUSATE SODIUM 50 MG TABLET
1.0000 | ORAL_TABLET | Freq: Two times a day (BID) | ORAL | Status: DC | PRN
Start: 2020-08-31 — End: 2020-09-02

## 2020-08-31 MED ORDER — ACETAMINOPHEN 325 MG TABLET
650.0000 mg | ORAL_TABLET | ORAL | Status: DC | PRN
Start: 2020-08-31 — End: 2020-09-02
  Administered 2020-08-31: 650 mg via ORAL
  Filled 2020-08-31: qty 2

## 2020-08-31 MED ORDER — INSULIN LISPRO 100 UNIT/ML SUB-Q SSIP
0.0000 [IU] | INJECTION | Freq: Four times a day (QID) | SUBCUTANEOUS | Status: DC | PRN
Start: 2020-08-31 — End: 2020-09-02
  Administered 2020-09-01 (×3): 2 [IU] via SUBCUTANEOUS
  Filled 2020-08-31: qty 300

## 2020-08-31 NOTE — ED Nurses Note (Signed)
Report given to Verlon Au, RN on 10W.

## 2020-08-31 NOTE — ED Triage Notes (Signed)
Patient arrives to ED via private vehicle. 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   Patient was driving and noticed she was having palpitations.  Patient stated her watch was reading 190's for her heart rate.   Patient reports shortness of breath and dizziness.     Patient does have a history of SVT.       Patient took an extra dose of Atenolol this am when the incident was happening.

## 2020-08-31 NOTE — H&P (Signed)
[facility]     Neurology H&P     Ed, Rayson, 30 y.o. female  Date of Admission:  08/31/2020  Date of Birth:  06/17/1990    PCP: Leland Johns, MD    Information obtained from: patient  Chief Complaint:  Seizure    HER:[id number] Marie Vasquez is a 30 y.o., White female who presents as a transfer from [facility] ED for seizure like activity. She presented to [facility] for chest pain and palpitations with a known history of SVT. She is on atenolol and had taken this today. She took a total of 100 mg this AM and checked her pulse which felt quick. Her apple watch reported her heart rate to be 190. She was lightheaded and dizzy. Her pulse was normal in [place]. She then was undergoing testing when she had generalized twitching with a desaturation. She has been transferred to [facility] for spell capture and monitoring.     Of note, she was seen on 05/22/20 at [facility] by Neurology. 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 She was evaluated and was slow to respond but recovered and stroke was ruled out. There was concern for hypoglycemic event. 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 It appears she was started on Keppra in July 2020. She was seen by Neurology at [facility] for spell characterization however no spell was captured, she then had an unresponsive event prior to discharge. Per review of Neurology note dated 01/08/2019 patient reported to that provider that a spell had been captured before and she had been told it had no evidence of epilepsy.       Semiology     Pre-spell - Prior to the spell, the patient had palpitations, heart racing, dyspnea, nausea, mid-sternal chest pain, she stated all occurring while on her drive to work this morning. She tried to  abort what she thought was her SVT by taking additional atenolol but it did not help.     Spell-    Eyes Open     LOC/loss of bowel or bladder control/tongue bite - No    The spell lasted possibly more than 20 minutes - told she was apneic     Patient is amnestic of events, but was told that she had eye fluttering movements and was unresponsive to voice and commands while in the ED at her workplace     Post-spell- Following the spell, the patient felt foggy, drowsy, felt weak            Triggers: Denies any of the below, states that he has felt her best in recent months  1. sleep deprivation   2. recent medication change   3. substance or alcohol use  4. head trauma  5. Fever/ infection    Known history of seizures  Onset:  First seizure at the age of 9 in July of 2020 while her father was ill, he passed in Mid-August of 2020. 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     Past History  - No history of head trauma followed by loss of consciousness  - No history  of meningitis/encephalitis   - History of birth trauma:     Family history of epilepsy: No known history.         Admission Source:  Transfer from another hospital -  [facility]  Advance Directives:  None-Discussed  Hospice involvement prior to admission?  Not applicable    Location (of pain): Quality (character of pain) Severity (minimal, mild, severe, scale or 1-10) Duration (how long has pain/sx present) Timing (when does pain/sx occur)  Context (activity at/before onset) Modifying Factors (what makes pain/sx  Better/worse) Associate Sign/Sx (what accompanies main pain/sx)    Past Medical History:   Diagnosis Date    Asthma     Diabetes mellitus, type 2 (CMS HCC)     Factor II deficiency (CMS HCC)     HTN (hypertension)     Hypothyroidism     PCOS (polycystic ovarian syndrome)     SVT (supraventricular tachycardia) (CMS HCC)         Past Surgical History:   Procedure Laterality Date    HX CHOLECYSTECTOMY         Medications Prior to Admission       Prescriptions    albuterol sulfate (PROVENTIL) 2.5 mg /3 mL (0.083 %) Inhalation Solution for Nebulization    Take 2.5 mg by inhalation Every 6 hours as needed    Ascorbic Acid 500 mg Oral Tablet, Chewable    Take 1 Tablet by mouth    atenoloL (TENORMIN) 25 mg Oral Tablet    Take 12.5 mg by mouth    Blood Sugar Diagnostic Strip    1 Strip    Blood-Glucose Meter Kit    Use as directed Dx: E 11.9    cetirizine (ZYRTEC) 10 mg Oral Tablet    Take 10 mg by mouth    dulaglutide (TRULICITY) 4.13 KG/4.0 mL Subcutaneous Pen Injector    0.75 mg by Subcutaneous route Every 7 days    escitalopram oxalate (LEXAPRO) 20 mg Oral Tablet    Take 20 mg by mouth    fluticasone propionate (FLONASE) 50 mcg/actuation Nasal Spray, Suspension    2 Sprays by Nasal route    Insulin Needles, Disposable, 31 gauge x 5/16" Needle    Use as directed with Lantus daily    levothyroxine (SYNTHROID) 150 mcg Oral Tablet    Take 150 mcg by mouth    Melatonin 5 mg Oral Capsule    Take 1 Capsule by mouth    MetFORMIN (GLUCOPHAGE) 1,000 mg Oral Tablet    Take 1,000 mg by mouth    metoclopramide HCl (REGLAN) 5 mg Oral Tablet    Take 5 mg by mouth    ondansetron (ZOFRAN ODT) 4 mg Oral Tablet, Rapid Dissolve    Take 4 mg by mouth Every 8 hours    pantoprazole (PROTONIX) 40 mg Oral Tablet, Delayed Release (E.C.)    Take 40 mg by mouth    rosuvastatin (CRESTOR) 5 mg Oral Tablet    Take 5 mg by mouth    traZODone (DESYREL) 50 mg Oral Tablet    Take 25 mg by mouth          Allergies   Allergen Reactions    Amoxicillin-Pot Clavulanate Hives/ Urticaria, Itching and Rash    Pseudoephedrine Hcl Hives/ Urticaria     Social History     Tobacco Use    Smoking status: Never Smoker    Smokeless tobacco: Never Used   Substance Use Topics    Alcohol use: Not  Currently         ROS: Other than ROS in the HPI, all other systems were  negative.    Exam:  Temperature: 36.6 C (97.9 F)  Heart Rate: 97  BP (Non-Invasive): (!) 129/100  Respiratory Rate: (!) 21  SpO2: 92 %  General: appears in good health  HENT:Head atraumatic and normocephalic  Neck: No JVD or thyromegaly or lymphadenopathy  Carotids:Carotids normal without bruit  Lungs: Clear to auscultation bilaterally.   Cardiovascular: regular rate and rhythm  Abdomen: Soft, non-tender  Extremities: No cyanosis or edema  Ophthalomscopic: normal w/o hemorrhages, exudates, or papilledema  Mental status:  Level of Consciousness: alert  Orientations: Alert and oriented x 3  MemoryRegistration, Recall, and Following of commands is normal  AttentionsAttention and Concentration are normal  Knowledge: Good  Language: Normal  Speech: Normal  Cranial nerves:   CN2: Visual acuity and fields intact  CN 3,4,6: EOMI, PERRLA  CN 5Facial sensation intact  CN 7Face symmetrical  CN 8: Hearing grossly intact  CN 9,10: Palate symmetric and gag normal  CN 11: Sternocleidomastoid and Trapezius have normal strength.  CN 12: Tongue normal with no fasiculations or deviation  Gait, Coordination, and Reflexes:   Gait: Normal  Coordination: Coordination is normal without tremor    Muscle tone: WNL  Muscle exam  Arm Right Left Leg Right Left   Deltoid 5/5 5/5 Iliopsoas 5/5 5/5   Biceps 5/5 5/5 Quads 5/5 5/5   Triceps 5/5 5/5 Hamstrings 5/5 5/5   Wrist Extension 5/5 5/5 Ankle Dorsi Flexion 5/5 5/5   Wrist Flexion 5/5 5/5 Ankle Plantar Flexion 5/5 5/5   Interossei 5/5 5/5 Ankle Eversion 5/5 5/5   APB 5/5 5/5 Ankle Inversion 5/5 5/5       Reflexes   RJ BJ TJ KJ AJ Plantars Hoffman's   Right 2+ 2+ 2+ 2+ 2+ Downgoing Not present   Left 2+ 2+ 2+ 2+ 2+ Downgoing Not present     Sensory: Sensory exam in the upper and lower extremities is normal  Normal color, texture and turgor without significant lesions or rashes  Diabetes Monitors:  No ulcerations    Labs:    I have reviewed all lab results.  CBC with Diff (Last 24 Hours):     Recent Results last 24 hours     08/31/20  0710   WBC 10.9   HGB 15.6   HCT 48.1*   MCV 92.5   PLTCNT 310   PMNS 53   LYMPHO 39   MONOCYTES 6   EOSINO 2   BASOPHILS 0   0.04     BMP (Last 24 Hours):    Recent Results last 24 hours     08/31/20  0710 08/31/20  0852   SODIUM 134*   135* 134*   POTASSIUM 3.6  --    CHLORIDE 102   101 104   CO2 21*  --    BUN 13  --    CREATININE 0.81  --    CALCIUM 9.8  --    GLUCOSENF 215*  --      TSH: 21.417  Troponin: 8  HCG: <1  Lactate: 2.0     Ref. Range 08/31/2020 07:10   TOTAL PROTEIN Latest Ref Range: 6.0 - 7.9 g/dL 8.3 (H)   ALBUMIN Latest Ref Range: 3.5 - 5.0 g/dL 4.7   BILIRUBIN, TOTAL Latest Ref Range: 0.3 - 1.3 mg/dL 0.6   BILIRUBIN,CONJUGATED Latest Ref Range: <0.3 mg/dL 0.2  AST (SGOT) Latest Ref Range: 8 - 41 U/L 18   ALT (SGPT) Latest Ref Range: <55 U/L 31   ALKALINE PHOSPHATASE Latest Ref Range: 40 - 110 U/L 82   LIPASE Latest Ref Range: 10 - 80 U/L 38       Review of reports and notes reveal:   CT Brain WO: Dated 08/31/20: per radiology report:  IMPRESSION:  No specific abnormality identified.    Independent Interpretation of images or specimens:  CT Brain WO: Dated 08/31/20: per my review:  No hemorrhage. Gray white differentiation is intact. No acute intracranial abnormality.     Assessment/Plan:  Active Hospital Problems    Diagnosis    Seizure (CMS HCC)    Seizure-like activity (CMS HCC)       Seizure like activity  Etiology: unclear. Reported history of non-epileptic events however it is unclear if events have truly been captured in the past.   - Admit to neurology for further evaluation   - Seizure precautions, Aspiration precautions  - Telemetry, Neuro checks Q4, Vitals.  - Extended bedside vEEG (admit to bed with v.monitoring- 10W/10E/9E/SE tower)   - Will plan for spell capture this admission if possible  - CBC, BMP, Mg, Phos, LFTs, CK, Vitamin B1/6/12, folate - pending  - TSH - 21.417, Free T4 - 0.82  - MRI brain w/wo contrast to evaluated for any  structural lesions - pending  - SS/PT/OT - pending   - Please avoid epileptogenic medications like imipenem, Ultram ,Webutrin ,Quinolones  - Per [place] law: Patient was advised to not drive motorized or nonmotorized vehicle for 6 months since last seizure or unexplained loss of consciousness. Advised not to engage in unsupervised activity which can pose risk of personal injury including heavy machinery work, operating in heights, bathing in bathtub or swimming unsupervised.        DNR Status this admission:  Full Code  Palliative/Supportive Care consulted?  no  Hospice Consulted?  Not applicable    Current Comorbid Conditions - Neurology H&P  Compression of the Brain:  no  Coma (GCS less than 8): Coma -Not applicable  TIA not applicable  Encephalopathy:  no  Encephalitis-Not applicable  Seizure-Not applicable   Respiratory Failure/Other-Not applicable  No  Coagulopathy Not applicable  N/A  Fatigue/Debility  N/A    DVT/PE Prophylaxis: Enoxaparin      Ulyses Southward, MD   08/31/2020, 20:59  Neurology, PGY-1  [facility]  Pager: [phone]        I saw and examined the patient.  I reviewed the resident's note.  I agree with the findings and plan of care as documented in the resident's note.  Any exceptions/additions are edited/noted.    Gemma Payor, MD

## 2020-08-31 NOTE — H&P (Incomplete)
Marie Vasquez     Neurology H&P     Marie Vasquez, Marie Vasquez, 30 y.o. female  Date of Admission:  08/31/2020  Date of Birth:  [birth year]    PCP: Leland Johns, MD    Information obtained from: patient  Chief Complaint:  Seizure    [id number] Marie Vasquez is a 30 y.o., White female who presents as a transfer from [facility] ED for seizure like activity. She presented to [facility] for chest pain and palpitations with a known history of SVT. She is on atenolol and had taken this today. She took a total of 100 mg this AM and checked her pulse which felt quick. Her apple watch reported her heart rate to be 190. She was lightheaded and dizzy. Her pulse was normal in [place]. She then was undergoing testing when she had generalized twitching with a desaturation. She has been transferred to [facility] for spell capture and monitoring.     Per patient, semiology is described as ***    Of note, she was seen on 05/22/20 at [facility] by Neurology. 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 She was evaluated and was slow to respond but recovered and stroke was ruled out. There was concern for hypoglycemic event. 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 It appears she was started on Keppra in July 2020. She was seen by Neurology at [facility] for spell characterization however no spell was captured, she then had an unresponsive event prior to discharge. Per review of Neurology note dated 01/08/2019 patient reported to that provider that a spell had been captured before and she had been told it had no evidence of epilepsy. ***     Admission Source:  Transfer from another hospital -   [facility]  Advance Directives:  None-Discussed  Hospice involvement prior to  admission?  Not applicable    Location (of pain): Quality (character of pain) Severity (minimal, mild, severe, scale or 1-10) Duration (how long has pain/sx present) Timing (when does pain/sx occur)  Context (activity at/before onset) Modifying Factors (what makes pain/sx  Better/worse) Associate Sign/Sx (what accompanies main pain/sx)    Past Medical History:   Diagnosis Date   . Asthma    . Diabetes mellitus, type 2 (CMS HCC)    . Factor II deficiency (CMS HCC)    . HTN (hypertension)    . Hypothyroidism    . PCOS (polycystic ovarian syndrome)    . SVT (supraventricular tachycardia) (CMS HCC)        Past Surgical History:   Procedure Laterality Date   . HX CHOLECYSTECTOMY         Medications Prior to Admission     Prescriptions    albuterol sulfate (PROVENTIL) 2.5 mg /3 mL (0.083 %) Inhalation Solution for Nebulization    Take 2.5 mg by inhalation Every 6 hours as needed    Ascorbic Acid 500 mg Oral Tablet, Chewable    Take 1 Tablet by mouth    atenoloL (TENORMIN) 25 mg Oral Tablet    Take 12.5 mg by mouth    Blood Sugar Diagnostic Strip    1 Strip    Blood-Glucose Meter Kit    Use as directed Dx: E 11.9  cetirizine (ZYRTEC) 10 mg Oral Tablet    Take 10 mg by mouth    dulaglutide (TRULICITY) 9.19 TY/6.0 mL Subcutaneous Pen Injector    0.75 mg by Subcutaneous route Every 7 days    escitalopram oxalate (LEXAPRO) 20 mg Oral Tablet    Take 20 mg by mouth    fluticasone propionate (FLONASE) 50 mcg/actuation Nasal Spray, Suspension    2 Sprays by Nasal route    Insulin Needles, Disposable, 31 gauge x 5/16" Needle    Use as directed with Lantus daily    levothyroxine (SYNTHROID) 150 mcg Oral Tablet    Take 150 mcg by mouth    Melatonin 5 mg Oral Capsule    Take 1 Capsule by mouth    MetFORMIN (GLUCOPHAGE) 1,000 mg Oral Tablet    Take 1,000 mg by mouth    metoclopramide HCl (REGLAN) 5 mg Oral Tablet    Take 5 mg by mouth    ondansetron (ZOFRAN ODT) 4 mg Oral Tablet, Rapid Dissolve    Take 4 mg by mouth Every 8 hours     pantoprazole (PROTONIX) 40 mg Oral Tablet, Delayed Release (E.C.)    Take 40 mg by mouth    rosuvastatin (CRESTOR) 5 mg Oral Tablet    Take 5 mg by mouth    traZODone (DESYREL) 50 mg Oral Tablet    Take 25 mg by mouth        Allergies   Allergen Reactions   . Amoxicillin-Pot Clavulanate Hives/ Urticaria, Itching and Rash   . Pseudoephedrine Hcl Hives/ Urticaria     Social History     Tobacco Use   . Smoking status: Never Smoker   . Smokeless tobacco: Never Used   Substance Use Topics   . Alcohol use: Not Currently     Past Family History: ***      ROS: {Ros - complete:30496}    Exam:  Temperature: 36.6 C (97.9 F)  Heart Rate: 97  BP (Non-Invasive): (!) 129/100  Respiratory Rate: (!) 21  SpO2: 92 %  General: {GENERAL :20860}  HENT:{HENT:20859}  Neck: {Neck:20861}  Carotids:{EXAM[place] CAROTID NEUROLOGY:50023956::"Carotids normal without bruit"}  Lungs: {Lung:20862}  Cardiovascular: {Cardiovascular:20864}  Abdomen: {Gastrointestinal:20865}  Extremities: {Extremities:20866}  Ophthalomscopic: {Fundi:209}  Glasgow: {Glasgow Coma:26881}  Mental status:  Level of Consciousness: {Level of Consciousness:50023916::"alert"}  Orientations: {Orientation:50023908::"Alert and oriented x 3"}  Memory{MEMORY:50023910::"Registration, Recall, and Following of commands is normal"}  Attentions{ATTENTION:50023911::"Attention and Concentration are normal"}  Knowledge: {KNOWLEDGE:50023912::"Good"}  Language: {LANGUAGE:50023913::"Normal"}  Speech: {SPEECH:50023914::"Normal"}  Cranial nerves:   CN2: {CRANIAL NERVE II:50023918::"Visual acuity and fields intact"}  CN 3,4,6: {CRANIAL NERVEs 3,4,6:21816::"EOMI, PERRLA"}  CN 5{CRANIAL NERVE 5:50023927::"Facial sensation intact"}  CN 7{CRANIAL NERVE 7:50023930::"Face symmetrical"}  CN 8: {CRANIAL NERVE 8:50023932::"Hearing grossly intact"}  CN 9,10: {EXAM[place] NEUROLOGIC CRANIAL NERVE 9,10:50023934::"Palate symmetric and gag normal"}  CN 11: {EXAM[place] NEUROLOGIC CRANIAL NERVE  11:50023937::"Sternocleidomastoid and Trapezius have normal strength."}  CN 12: {CRANIAL NERVE 12:50023940::"Tongue normal with no fasiculations or deviation"}  Gait, Coordination, and Reflexes:   Gait: {Neurologic Gait:50023942::"Normal"}  Coordination: {Coordination:50023944::"Coordination is normal without tremor"}    Muscle tone: {TONE2:20506}  Muscle exam  Arm Right Left Leg Right Left   Deltoid 5/5 5/5 Iliopsoas 5/5 5/5   Biceps 5/5 5/5 Quads 5/5 5/5   Triceps 5/5 5/5 Hamstrings 5/5 5/5   Wrist Extension 5/5 5/5 Ankle Dorsi Flexion 5/5 5/5   Wrist Flexion 5/5 5/5 Ankle Plantar Flexion 5/5 5/5   Interossei 5/5 5/5 Ankle Eversion 5/5 5/5   APB 5/5 5/5 Ankle  Inversion 5/5 5/5       Reflexes   RJ BJ TJ KJ AJ Plantars Hoffman's   Right 2+ 2+ 2+ 2+ 2+ Downgoing Not present   Left 2+ 2+ 2+ 2+ 2+ Downgoing Not present     Sensory: {EXAM[place]U NEUROLOGIC SENSORY:21817::"Sensory exam in the upper and lower extremities is normal"}  {SKIN EXAM (AMB):21020500}  Diabetes Monitors:  {NEURODIAB:50021320}    Labs:    I have reviewed all lab results.  CBC with Diff (Last 24 Hours):    Recent Results last 24 hours     08/31/20  0710   WBC 10.9   HGB 15.6   HCT 48.1*   MCV 92.5   PLTCNT 310   PMNS 53   LYMPHO 39   MONOCYTES 6   EOSINO 2   BASOPHILS 0  0.04     BMP (Last 24 Hours):    Recent Results last 24 hours     08/31/20  0710 08/31/20  0852   SODIUM 134*  135* 134*   POTASSIUM 3.6  --    CHLORIDE 102  101 104   CO2 21*  --    BUN 13  --    CREATININE 0.81  --    CALCIUM 9.8  --    GLUCOSENF 215*  --      TSH: 21.417  Troponin: 8  HCG: <1  Lactate: 2.0     Ref. Range 08/31/2020 07:10   TOTAL PROTEIN Latest Ref Range: 6.0 - 7.9 g/dL 8.3 (H)   ALBUMIN Latest Ref Range: 3.5 - 5.0 g/dL 4.7   BILIRUBIN, TOTAL Latest Ref Range: 0.3 - 1.3 mg/dL 0.6   BILIRUBIN,CONJUGATED Latest Ref Range: <0.3 mg/dL 0.2   AST (SGOT) Latest Ref Range: 8 - 41 U/L 18   ALT (SGPT) Latest Ref Range: <55 U/L 31   ALKALINE PHOSPHATASE Latest Ref Range: 40 -  110 U/L 82   LIPASE Latest Ref Range: 10 - 80 U/L 38       Review of reports and notes reveal:   CT Brain WO: Dated 08/31/20: per radiology report:  IMPRESSION:  No specific abnormality identified.    Independent Interpretation of images or specimens:  CT Brain WO: Dated 08/31/20: per my review:  No hemorrhage. Gray white differentiation is intact. No acute intracranial abnormality.     Assessment/Plan:  Active Hospital Problems    Diagnosis   . Seizure (CMS [facility])       Seizure like activity  Etiology: unclear. Reported history of non-epileptic events however it is unclear if events have truly been captured in the past.   - Admit to neurology for further evaluation   - Seizure precautions, Aspiration precautions  - Telemetry, Neuro checks Q4, Vitals.  - Extended bedside vEEG (admit to bed with v.monitoring- 10W/10E/9E/SE tower)   - Will plan for spell capture this admission if possible  - CBC, BMP, Mg, Phos, TSH, LFTs, CK, Vitamin B1/6/12, folate ***  - MRI brain w/wo contrast to evaluated for any structural lesions ***  - SS/PT/OT    - Please avoid epileptogenic medications like imipenem, Ultram ,Webutrin ,Quinolones  - Per [place] law: Patient was advised to not drive motorized or nonmotorized vehicle for 6 months since last seizure or unexplained loss of consciousness. Advised not to engage in unsupervised activity which can pose risk of personal injury including heavy machinery work, operating in heights, bathing in bathtub or swimming unsupervised.        DNR Status this  admission:  Full Code  Palliative/Supportive Care consulted?  no  Hospice Consulted?  Not applicable    Current Comorbid Conditions - Neurology H&P  Compression of the Brain:  no  Coma (GCS less than 8): Coma -Not applicable  TIA not applicable  Encephalopathy:  no  Encephalitis-Not applicable  Seizure-Not applicable   Respiratory Failure/Other-Not applicable  No  Coagulopathy Not applicable  N/A  Fatigue/Debility N/A    DVT/PE Prophylaxis:  Enoxaparin    Emmit Pomfret, MD

## 2020-08-31 NOTE — ED Provider Notes (Signed)
[facility] Emergency Department          Chief Complaint:  Patient presents with     Chief Complaint   Patient presents with    Shortness of Breath    Tachycardia         HPI    Marie Vasquez, date of birth 09-21-1990, is a 30 y.o. female who presents to the Emergency Department with shortness of breath and tachycardia.  The patient reports that she has a history of SVT that she follows with a cardiologist.  She has a family history of ischemic myopathy.  The patient states that she was feeling palpitations last night which is not unusual for her.  She went to bed and awoke this morning continued to have PVCs and on her way to work felt her heart began to race.  She takes atenolol for her SVT and took another dose.  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  The patient states that on arrival she was feeling lightheaded and dizzy but that this has resolved.  She notes that her pulse feels as if it is back to normal and she is beginning to feel little bit better but continues to have some chest tightness.  She denies any numbness, tingling, weakness, fevers, or current shortness of breath.      Review of Systems   Constitutional: Positive for fatigue. Negative for activity change, appetite change, chills and fever.   HENT: Negative for congestion, sore throat and voice change.    Eyes: Negative for visual disturbance.   Respiratory: Positive for chest tightness. Negative for cough, shortness of breath and wheezing.    Cardiovascular: Positive for chest pain and palpitations. Negative for leg swelling.   Gastrointestinal: Negative for abdominal pain, constipation, diarrhea, nausea and vomiting.   Genitourinary: Negative for difficulty urinating, dysuria, hematuria and vaginal bleeding.   Musculoskeletal: Negative for arthralgias, neck pain and neck stiffness.   Skin: Negative for rash and wound.    Neurological: Positive for dizziness and light-headedness. Negative for syncope, facial asymmetry, weakness and numbness.   Psychiatric/Behavioral: Negative for agitation.   All other systems reviewed and are negative.        Physical Exam  Vitals and nursing note reviewed.   Constitutional:       General: She is not in acute distress.     Appearance: Normal appearance. She is well-developed. She is obese. She is not ill-appearing or diaphoretic.   HENT:      Head: Normocephalic and atraumatic.      Right Ear: External ear normal.      Left Ear: External ear normal.      Nose: Nose normal. No congestion or rhinorrhea.      Mouth/Throat:      Mouth: Mucous membranes are moist.      Pharynx: Oropharynx is clear. No oropharyngeal exudate.   Eyes:      General: No scleral icterus.     Extraocular Movements: Extraocular movements intact.      Conjunctiva/sclera: Conjunctivae normal.      Pupils: Pupils are equal, round, and reactive to light.   Cardiovascular:      Rate and Rhythm: Normal rate and regular rhythm.      Pulses: Normal pulses.      Heart sounds: Normal heart sounds. No murmur heard.    No friction rub.  No gallop.   Pulmonary:      Effort: Pulmonary effort is normal. No respiratory distress.      Breath sounds: Normal breath sounds. No stridor. No wheezing, rhonchi or rales.   Abdominal:      General: Abdomen is flat. Bowel sounds are normal. There is no distension.      Palpations: Abdomen is soft.      Tenderness: There is no abdominal tenderness. There is no guarding or rebound.      Hernia: No hernia is present.   Musculoskeletal:         General: No swelling. Normal range of motion.      Cervical back: Normal range of motion and neck supple.   Skin:     General: Skin is warm and dry.      Capillary Refill: Capillary refill takes less than 2 seconds.      Coloration: Skin is not jaundiced.      Findings: No rash.   Neurological:      General: No focal deficit present.      Mental Status: She is alert  and oriented to person, place, and time. Mental status is at baseline.      Sensory: No sensory deficit.      Motor: No weakness.   Psychiatric:         Mood and Affect: Mood normal.         Behavior: Behavior normal.         Vitals:  Filed Vitals:    08/31/20 1626 08/31/20 1630 08/31/20 2000 08/31/20 2142   BP: (!) 141/99   117/80   Pulse: 73   64   Resp: 18   16   Temp: 36.8 C (98.2 F)   37.2 C (99 F)   SpO2:  93% 97%        Past Medical History:  Diagnosis     Past Medical History:   Diagnosis Date    Asthma     Diabetes mellitus, type 2 (CMS HCC)     Factor II deficiency (CMS HCC)     HTN (hypertension)     Hypothyroidism     PCOS (polycystic ovarian syndrome)     SVT (supraventricular tachycardia) (CMS HCC)        Past Surgical History:  Past Surgical History:   Procedure Laterality Date    Hx cholecystectomy         Family History: No family history on file.    Social History     Social History     Tobacco Use    Smoking status: Never Smoker    Smokeless tobacco: Never Used   Haematologist Use: Never used   Substance Use Topics    Alcohol use: Not Currently    Drug use: Never         Social History Main Topics     Social History     Substance and Sexual Activity   Drug Use Never           Allergies   Allergen Reactions    Amoxicillin-Pot Clavulanate Hives/ Urticaria, Itching and Rash    Pseudoephedrine Hcl Hives/ Urticaria           Vital Signs:  Pre-disposition vitals  Filed Vitals:    08/31/20 1626 08/31/20 1630 08/31/20 2000 08/31/20 2142   BP: (!) 141/99   117/80   Pulse: 73   64   Resp: 18   16  Temp: 36.8 C (98.2 F)   37.2 C (99 F)   SpO2:  93% 97%        Old records reviewed by me:  None       Diagnostics:    Labs:    Results for orders placed or performed during the hospital encounter of 08/31/20   BASIC METABOLIC PANEL   Result Value Ref Range    SODIUM 135 (L) 136 - 145 mmol/L    POTASSIUM 3.6 3.5 - 5.1 mmol/L    CHLORIDE 101 96 - 111 mmol/L    CO2 TOTAL 21 (L) 22 - 32  mmol/L    ANION GAP 13 4 - 13 mmol/L    CALCIUM 9.8 8.5 - 10.2 mg/dL    GLUCOSE 161 (H) 65 - 125 mg/dL    BUN 13 8 - 25 mg/dL    CREATININE 0.96 0.45 - 1.10 mg/dL    BUN/CREA RATIO 16 6 - 22    ESTIMATED GFR >60 >60 mL/min/1.13m2   D-DIMER   Result Value Ref Range    D-DIMER <200 <=232 ng/mL DDU   HCG, PLASMA OR SERUM QUANTITATIVE, PREGNANCY   Result Value Ref Range    HCG QUANTITATIVE PREGNANCY <1 <5 IU/L   HEPATIC FUNCTION PANEL   Result Value Ref Range    ALBUMIN 4.7 3.5 - 5.0 g/dL    ALKALINE PHOSPHATASE 82 40 - 110 U/L    ALT (SGPT) 31 <55 U/L    AST (SGOT) 18 8 - 41 U/L    BILIRUBIN TOTAL 0.6 0.3 - 1.3 mg/dL    BILIRUBIN DIRECT 0.2 <0.3 mg/dL    PROTEIN TOTAL 8.3 (H) 6.0 - 7.9 g/dL   LIPASE   Result Value Ref Range    LIPASE 38 10 - 80 U/L   PT/INR   Result Value Ref Range    PROTHROMBIN TIME 10.7 9.1 - 13.9 seconds    INR 0.93 0.80 - 1.20   PTT (PARTIAL THROMBOPLASTIN TIME)   Result Value Ref Range    APTT 35.9 24.2 - 37.5 seconds   TROPONIN-I (FOR ED ONLY)   Result Value Ref Range    TROPONIN I 8 0 - 30 ng/L   THYROID STIMULATING HORMONE W/ FREE T4 REFLEX   Result Value Ref Range    TSH 21.417 (H) 0.350 - 5.000 uIU/mL   VENOUS BLOOD GAS/LACTATE/LYTES (NA/K/CA/CL/GLUC)   Result Value Ref Range    %FIO2 (VENOUS) 21.0 %    PH (VENOUS) 7.48 (H) 7.31 - 7.41    PCO2 (VENOUS) 30.00 (L) 41.00 - 51.00 mm/Hg    PO2 (VENOUS) 42.0 35.0 - 50.0 mm/Hg    BASE DEFICIT 0.3 -3.0 - 3.0 mmol/L    BICARBONATE (VENOUS) 24.3 22.0 - 26.0 mmol/L    SODIUM 134 (L) 137 - 145 mmol/L    WHOLE BLOOD POTASSIUM 3.7 3.5 - 4.6 mmol/L    CHLORIDE 102 101 - 111 mmol/L    IONIZED CALCIUM 1.17 1.10 - 1.35 mmol/L    GLUCOSE 230 (H) 60 - 105 mg/dL    LACTATE 1.9 (H) 0.0 - 1.3 mmol/L   CBC WITH DIFF   Result Value Ref Range    WBC 10.9 3.7 - 11.0 x103/uL    RBC 5.20 3.85 - 5.22 x106/uL    HGB 15.6 11.5 - 16.0 g/dL    HCT 40.9 (H) 81.1 - 46.0 %    MCV 92.5 78.0 - 100.0 fL    MCH 30.0 26.0 - 32.0 pg  MCHC 32.4 31.0 - 35.5 g/dL    RDW-CV 59.4  58.5 - 15.5 %    PLATELETS 310 150 - 400 x103/uL    MPV 10.3 8.7 - 12.5 fL    NEUTROPHIL % 53 %    LYMPHOCYTE % 39 %    MONOCYTE % 6 %    EOSINOPHIL % 2 %    BASOPHIL % 0 %    NEUTROPHIL # 5.79 1.50 - 7.70 x103/uL    LYMPHOCYTE # 4.22 1.00 - 4.80 x103/uL    MONOCYTE # 0.64 0.20 - 1.10 x103/uL    EOSINOPHIL # 0.22 <=0.50 x103/uL    BASOPHIL # 0.04 <=0.20 x103/uL    IMMATURE GRANULOCYTE % 0 0 - 1 %    IMMATURE GRANULOCYTE # <0.04 <0.10 x103/uL   THYROXINE, FREE (FREE T4)   Result Value Ref Range    THYROXINE (T4), FREE 0.82 0.70 - 1.25 ng/dL   VENOUS BLOOD GAS/LACTATE/LYTES (NA/K/CA/CL/GLUC)   Result Value Ref Range    %FIO2 (VENOUS) 21.0 %    PH (VENOUS) 7.47 (H) 7.31 - 7.41    PCO2 (VENOUS) 28.00 (L) 41.00 - 51.00 mm/Hg    PO2 (VENOUS) 31.0 (L) 35.0 - 50.0 mm/Hg    BASE DEFICIT 2.0 -3.0 - 3.0 mmol/L    BICARBONATE (VENOUS) 22.5 22.0 - 26.0 mmol/L    SODIUM 134 (L) 137 - 145 mmol/L    WHOLE BLOOD POTASSIUM 3.5 3.5 - 4.6 mmol/L    CHLORIDE 104 101 - 111 mmol/L    IONIZED CALCIUM 1.14 1.10 - 1.35 mmol/L    GLUCOSE 194 (H) 60 - 105 mg/dL    LACTATE 2.0 (H) 0.0 - 1.3 mmol/L   COVID-19 SCREENING - Direct Admit/Transfer/ED Admit - NON-PUI   Result Value Ref Range    SARS-CoV-2 Not Detected Not Detected    INFLUENZA VIRUS TYPE A Not Detected Not Detected    INFLUENZA VIRUS TYPE B Not Detected Not Detected    RESPIRATORY SYNCTIAL VIRUS (RSV) Not Detected Not Detected   URINALYSIS, MACROSCOPIC   Result Value Ref Range    SPECIFIC GRAVITY 1.026 1.005 - 1.030    GLUCOSE >= 500 (A) Negative mg/dL    PROTEIN Negative Negative mg/dL    BILIRUBIN Negative Negative mg/dL    UROBILINOGEN Negative Negative mg/dL    PH 5.0 5.0 - 8.0    BLOOD Large (A) Negative mg/dL    KETONES Negative Negative mg/dL    NITRITE Negative Negative    LEUKOCYTES Trace (A) Negative WBCs/uL    APPEARANCE Cloudy (A) Clear    COLOR Normal (Yellow) Normal (Yellow)   URINALYSIS, MICROSCOPIC   Result Value Ref Range    WBCS 6.0 <11.0 /hpf    RBCS 17.0 (H)  <6.0 /hpf    BACTERIA Occasional or less Occasional or less /hpf    SQUAMOUS EPITHELIAL CELLS Several (A) Occasional or less /lpf    MUCOUS Light Light /lpf   ECG 12 LEAD   Result Value Ref Range    Ventricular rate 88 BPM    Atrial Rate 88 BPM    PR Interval 132 ms    QRS Duration 78 ms    QT Interval 358 ms    QTC Calculation 433 ms    Calculated P Axis 28 degrees    Calculated R Axis 31 degrees    Calculated T Axis 23 degrees   POC BLOOD GLUCOSE (RESULTS)   Result Value Ref Range    GLUCOSE, POC 128 (H) 70 -  105 mg/dl   POC BLOOD GLUCOSE (RESULTS)   Result Value Ref Range    GLUCOSE, POC 134 (H) 70 - 105 mg/dl     Labs reviewed and interpreted by me.    Radiology:    Results for orders placed or performed during the hospital encounter of 08/31/20   XR AP MOBILE CHEST     Status: None    Narrative    Elwyn NICOLE Bell  Female, 30 years old.    XR AP MOBILE CHEST performed on 08/31/2020 7:16 AM.    REASON FOR EXAM:  SOB    TECHNIQUE: 1 views/1 images submitted for interpretation.    COMPARISON:  05/23/2020    FINDINGS:  The heart is normal in size. No focal consolidation, pleural effusion, or pneumothorax is identified. Included portions of the upper abdomen are unremarkable.      Impression    No acute cardiopulmonary process.   CT BRAIN WO IV CONTRAST     Status: None    Narrative    Victoire NICOLE Akamine  Female, 30 years old.    CT BRAIN WO IV CONTRAST performed on 08/31/2020 9:15 AM.    REASON FOR EXAM:  New seizure    RADIATION DOSE: 1121.80 mGycm    COMPARISON: May 22, 2020    FINDINGS:  Soft tissues of the face and scalp appear unremarkable. Calvarium appears unremarkable. Paranasal sinuses are clear. There is no intracranial hemorrhage or mass effect or specific evidence for infarct.      Impression    No specific abnormality identified.       EKG:  12 lead EKG interpreted by me shows normal EKG, normal sinus rhythm      Procedures:  None    Orders:  Orders Placed This Encounter    URINE CULTURE     XR AP MOBILE CHEST    CT BRAIN WO IV CONTRAST    BASIC METABOLIC PANEL    CBC/DIFF    D-DIMER    HCG, PLASMA OR SERUM QUANTITATIVE, PREGNANCY    HEPATIC FUNCTION PANEL    LIPASE    PT/INR    PTT (PARTIAL THROMBOPLASTIN TIME)    TROPONIN-I (FOR ED ONLY)    THYROID STIMULATING HORMONE W/ FREE T4 REFLEX    VENOUS BLOOD GAS/LACTATE/LYTES (NA/K/CA/CL/GLUC)    CBC WITH DIFF    THYROXINE, FREE (FREE T4)    VENOUS BLOOD GAS/LACTATE/LYTES (NA/K/CA/CL/GLUC)    COVID-19 SCREENING - Direct Admit/Transfer/ED Admit - NON-PUI    AST (SGOT) - TOMORROW    ALT (SGPT) - TOMORROW    CREATINE KINASE, CK    URINALYSIS, MACROSCOPIC AND MICROSCOPIC W/CULTURE REFLEX    URINALYSIS, MACROSCOPIC    URINALYSIS, MICROSCOPIC    HGA1C (HEMOGLOBIN A1C WITH EST AVG GLUCOSE)    CBC/DIFF    BASIC METABOLIC PANEL, NON-FASTING    MAGNESIUM    PHOSPHORUS    CBC WITH DIFF    ECG 12 LEAD    ECG 12-LEAD    POCT WHOLE BLOOD GLUCOSE    EEG - CONTINUOUS VIDEO    PATIENT CLASS/LEVEL OF CARE DESIGNATION - RUBY    PATIENT CLASS/LEVEL OF CARE DESIGNATION    NS bolus infusion 1,000 mL    LORazepam (ATIVAN) 2 mg/mL injection ---Cabinet Override    enoxaparin PF (LOVENOX) 40 mg/0.4 mL SubQ injection    acetaminophen (TYLENOL) tablet    sennosides-docusate sodium (SENOKOT-S) 8.6-50mg  per tablet    SSIP insulin lispro 100 units/mL injection    escitalopram (  LEXAPRO) tablet       Encounter Diagnoses   Name Primary?    Seizure-like activity (CMS HCC) Yes    Shortness of breath          ED Course/Medical Decision Making:  Thurma Myrian Botello is a 30 y.o. female who presents to the Emergency Department with shortness of breath and chest pain. Initial evaluation showed the patient resting comfortably in bed and in no acute distress although she was having some active chest pain at the time of evaluation. Initial vital signs were within normal limits aside from some borderline hypertension.    Differential diagnosis included  but was not limited to SVT, other dysrhythmia, ACS, pneumonia, pneumothorax, and pulmonary embolism.  Given the patient's vital signs and history she was PERC negative and therefore pulmonary embolism seems unlikely. Given the patients symptoms and chief complaint labs and imaging were obtained. Labs showed CBC within normal limits, BMP within normal limits, and free T4 within normal limits, hepatic function panel within normal limits, troponin of 8, negative pregnancy test, urinalysis without UTI, and a VBG showing a lactate of 1.9 and a pH of 7.48.  The patient did appear to be anxious given her father's history of death from ischemic cardiomyopathy previously.  The patient is concerned that she may also have this.  While in the emergency department the patient appeared to have a seizure.  She was noted to be hypoxic on the monitor and upon evaluation had high twitching and was unresponsive.  She became hypoxic down to 60%.  The patient was bagged and did not appear to be initiating her own breaths at this time.  The patient was easily bagged and her seizure resolved spontaneously.  The patient did appear to have a postictal period of approximately 15 minutes where she seemed confused but was easily redirectable.  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  The patient states that she has a history of these but no and has been able to confirm that they are true seizures.  She notes that she has never been told that she got hypoxic during her spells previously.  Given the hypoxia I am concerned for to her seizure activity as well as her postictal period.  CT scan and chest x-ray were obtained following the seizure.  No acute intracranial hemorrhage or mass was noted on CT scan and chest x-ray returned with normal Carter pulmonary silhouette.  I discussed  care with Dr. Shawnie Dapper of Neurology who agreed to admit the patient for further workup and care of her seizures.  The patient was monitor the emergency department and remained hemodynamically stable without any further seizure-like activity.  The patient was then admitted for further workup and care.  No episodes of SVT were seen while the patient was in the emergency department but given her lightheadedness and dizziness with pulse in the 190s I was concerned that this could be a cause of her initial presentation.    The patient was in agreement with this plan. All further questions were answered to the patient's and mother's satisfaction.      Consults: None      Disposition: Admitted           Current Discharge Medication List        Carlyon Prows, MD  09/01/2020, 00:19

## 2020-08-31 NOTE — Care Management Notes (Signed)
Dr. Traci Sermon connected with Dr. Shawnie Dapper and she accepted to Neurology floor with tele.  Patient presented with chest discomfort (history of SVT).  Patient had seizure and O2 sat dropped and eyes twitched.  Patient was bagged and received ativan.  Previously worked up in [place] for seizures and dx with non-epileptic seizures (don't believe episode was ever caught on EEG).  CT head negative.

## 2020-08-31 NOTE — ED Nurses Note (Signed)
Attempted to called report to 10W. RN to call back.

## 2020-08-31 NOTE — ED Nurses Note (Signed)
Report given to EMS at bedside. Patient left the ED via [facility] at this time.

## 2020-09-01 DIAGNOSIS — F411 Generalized anxiety disorder: Secondary | ICD-10-CM

## 2020-09-01 DIAGNOSIS — F329 Major depressive disorder, single episode, unspecified: Secondary | ICD-10-CM

## 2020-09-01 LAB — ECG 12-LEAD
Atrial Rate: 72 {beats}/min
Calculated P Axis: 29 degrees
Calculated R Axis: 28 degrees
Calculated T Axis: 28 degrees
PR Interval: 148 ms
QRS Duration: 82 ms
QT Interval: 368 ms
QTC Calculation: 402 ms
Ventricular rate: 72 {beats}/min

## 2020-09-01 LAB — POC BLOOD GLUCOSE (RESULTS)
GLUCOSE, POC: 160 mg/dl — ABNORMAL HIGH (ref 70–105)
GLUCOSE, POC: 157 mg/dl — ABNORMAL HIGH (ref 70–105)
GLUCOSE, POC: 124 mg/dl — ABNORMAL HIGH (ref 70–105)
GLUCOSE, POC: 119 mg/dl — ABNORMAL HIGH (ref 70–105)

## 2020-09-01 LAB — CBC WITH DIFF
BASOPHIL #: <0.1 10*3/uL (ref <=0.20)
BASOPHIL %: 1 %
EOSINOPHIL #: 0.23 10*3/uL (ref <=0.50)
EOSINOPHIL %: 3 %
HCT: 44.5 % (ref 34.8–46.0)
HGB: 14.5 g/dL (ref 11.5–16.0)
IMMATURE GRANULOCYTE #: <0.1 10*3/uL (ref <0.10)
IMMATURE GRANULOCYTE %: 0 % (ref 0–1)
LYMPHOCYTE #: 3.28 10*3/uL (ref 1.00–4.80)
LYMPHOCYTE %: 37 %
MCH: 30.4 pg (ref 26.0–32.0)
MCHC: 32.6 g/dL (ref 31.0–35.5)
MCV: 93.3 fL (ref 78.0–100.0)
MONOCYTE #: 0.59 10*3/uL (ref 0.20–1.10)
MONOCYTE %: 7 %
MPV: 10.5 fL (ref 8.7–12.5)
NEUTROPHIL #: 4.63 10*3/uL (ref 1.50–7.70)
NEUTROPHIL %: 52 %
PLATELETS: 275 10*3/uL (ref 150–400)
RBC: 4.77 10*6/uL (ref 3.85–5.22)
RDW-CV: 12.4 % (ref 11.5–15.5)
WBC: 8.8 10*3/uL (ref 3.7–11.0)

## 2020-09-01 LAB — HGA1C (HEMOGLOBIN A1C WITH EST AVG GLUCOSE)
ESTIMATED AVERAGE GLUCOSE: 163 mg/dL
HEMOGLOBIN A1C: 7.3 % — ABNORMAL HIGH (ref 4.0–5.6)

## 2020-09-01 LAB — BASIC METABOLIC PANEL
ANION GAP: 7 mmol/L (ref 4–13)
BUN/CREA RATIO: 14 (ref 6–22)
BUN: 10 mg/dL (ref 8–25)
CALCIUM: 8.6 mg/dL (ref 8.5–10.0)
CHLORIDE: 106 mmol/L (ref 96–111)
CO2 TOTAL: 23 mmol/L (ref 22–30)
CREATININE: 0.71 mg/dL (ref 0.60–1.05)
ESTIMATED GFR: >90 mL/min/BSA (ref >=60)
GLUCOSE: 167 mg/dL — ABNORMAL HIGH (ref 65–125)
POTASSIUM: 3.8 mmol/L (ref 3.5–5.1)
SODIUM: 136 mmol/L (ref 136–145)

## 2020-09-01 LAB — PHOSPHORUS: PHOSPHORUS: 2.9 mg/dL (ref 2.4–4.7)

## 2020-09-01 LAB — ALT (SGPT): ALT (SGPT): 25 U/L — ABNORMAL HIGH (ref 8–22)

## 2020-09-01 LAB — CREATINE KINASE (CK), TOTAL, SERUM: CREATINE KINASE: 95 U/L (ref 25–190)

## 2020-09-01 LAB — AST (SGOT): AST (SGOT): 17 U/L (ref 8–45)

## 2020-09-01 LAB — MAGNESIUM: MAGNESIUM: 1.9 mg/dL (ref 1.8–2.6)

## 2020-09-01 MED ORDER — LEVOTHYROXINE 150 MCG TABLET
150.0000 ug | ORAL_TABLET | Freq: Every morning | ORAL | Status: DC
Start: 2020-09-02 — End: 2020-09-02
  Administered 2020-09-02: 150 ug via ORAL
  Filled 2020-09-01: qty 1

## 2020-09-01 MED ORDER — CETIRIZINE 10 MG TABLET
10.0000 mg | ORAL_TABLET | Freq: Every day | ORAL | Status: DC
Start: 2020-09-02 — End: 2020-09-02
  Administered 2020-09-02: 10 mg via ORAL
  Filled 2020-09-01: qty 1

## 2020-09-01 MED ORDER — FAMOTIDINE 20 MG TABLET
20.0000 mg | ORAL_TABLET | Freq: Two times a day (BID) | ORAL | Status: DC
Start: 2020-09-01 — End: 2020-09-02
  Administered 2020-09-01: 20 mg via ORAL
  Administered 2020-09-01: 0 mg via ORAL
  Administered 2020-09-02: 20 mg via ORAL
  Filled 2020-09-01 (×2): qty 1

## 2020-09-01 NOTE — Progress Notes (Signed)
[facility]  Neurology Progress Note      Marie Vasquez, Marie Vasquez, 30 y.o. female  Date of Admission:  08/31/2020  Date of service: 09/01/2020  Date of Birth:  1991-06-03      Chief Complaint: Seizure like activity, desaturation  Pt's condition today: stable    Subjective: Ms. Byrd feels well this morning. She did not have further events overnight. She is curious if this could have been a cardiogenic syncope due to close nature with chest pain and tachypnea. Worth noting, she describes a chest tightness with tachypnea for a few minutes followed by dizziness, seeing spots, and then loss of consciousness.    Vital Signs:  Temp (24hrs) Max:37.2 C (99 F)      Systolic (24hrs), Avg:126 , Min:100 , Max:141     Diastolic (24hrs), Avg:89, Min:66, Max:101    Temp  Avg: 36.9 C (98.4 F)  Min: 36.7 C (98.1 F)  Max: 37.2 C (99 F)  MAP (Non-Invasive)  Avg: 100.9 mmHG  Min: 77 mmHG  Max: 113 mmHG  Pulse  Avg: 79.2  Min: 61  Max: 97  Resp  Avg: 17.7  Min: 11  Max: 31  SpO2  Avg: 95.8 %  Min: 92 %  Max: 100 %       Today's Physical Exam:  General:  alert  Mental status:  Alert and oriented x 3  Memory:  Registration, Recall, and Following of commands is normal  Attention:  Attention and Concentration are normal  Knowledge:  Good  Language and Speech:  Normal and Normal  Cranial nerves:   Cranial nerves 2-12 are normal  Muscle tone:  WNL  Motor strength:  Motor strength is normal throughout.  Sensory:  Sensory exam in the upper and lower extremities is normal  Gait:  unable to assess. Reason: Connected to EEG  Coordination:  Coordination is normal without tremor  Reflexes:  Reflexes are 2/2 throughout    Current Medications:  acetaminophen (TYLENOL) tablet, 650 mg, Oral, Q4H PRN  enoxaparin PF (LOVENOX) 40 mg/0.4 mL SubQ injection, 40 mg, Subcutaneous, Q24H  escitalopram (LEXAPRO) tablet, 20 mg, Oral, NIGHTLY  LORazepam (ATIVAN) 2 mg/mL injection ---Cabinet Override, , ,   sennosides-docusate sodium (SENOKOT-S)  8.6-50mg  per tablet, 1 Tablet, Oral, 2x/day PRN  SSIP insulin lispro 100 units/mL injection, 0-12 Units, Subcutaneous, 4x/day PRN        I/O:  I/O last 24 hours:      Intake/Output Summary (Last 24 hours) at 09/01/2020 0723  Last data filed at 08/31/2020 1626  Gross per 24 hour   Intake 1320 ml   Output --   Net 1320 ml     I/O current shift:  No intake/output data recorded.  Last BM: PTA    Labs  Please indicate ordered or reviewed)  Reviewed: I have reviewed all lab results.  CBC with Diff (Last 24 Hours):    Recent Results last 24 hours     09/01/20  0646   WBC 8.8   HGB 14.5   HCT 44.5   MCV 93.3   PLTCNT 275   PMNS 52   LYMPHO 37   MONOCYTES 7   EOSINO 3   BASOPHILS 1   <0.10     BMP (Last 24 Hours):    Recent Results last 24 hours     08/31/20  0852 09/01/20  0646   SODIUM 134* 136   POTASSIUM  --  3.8   CHLORIDE 104 106   CO2  --  23   BUN  --  10   CREATININE  --  0.71   CALCIUM  --  8.6   GLUCOSENF  --  167*      Ref. Range 08/31/2020 07:10   TOTAL PROTEIN Latest Ref Range: 6.0 - 7.9 g/dL 8.3 (H)   ALBUMIN Latest Ref Range: 3.5 - 5.0 g/dL 4.7   BILIRUBIN, TOTAL Latest Ref Range: 0.3 - 1.3 mg/dL 0.6   BILIRUBIN,CONJUGATED Latest Ref Range: <0.3 mg/dL 0.2   AST (SGOT) Latest Ref Range: 8 - 41 U/L 18   ALT (SGPT) Latest Ref Range: <55 U/L 31   ALKALINE PHOSPHATASE Latest Ref Range: 40 - 110 U/L 82   LIPASE Latest Ref Range: 10 - 80 U/L 38     Troponin: 8  Hgb A1c: 7.3    Review of reports and notes reveal:   CT Brain WO: Dated 08/31/20: per radiology report:  IMPRESSION:  No specific abnormality identified.    Independent Interpretation of images or specimens:  CT Brain WO: Dated 08/31/20: per my review:  No hemorrhage. Gray white differentiation is intact. No acute intracranial abnormality.     Patient/ Family Discussion: Updated patient at bedside    Assessment/Plan:  Active Hospital Problems    Diagnosis    Seizure (CMS [facility])    Seizure-like activity (CMS HCC)     Seizure like activity  Etiology: unclear.  Reported history of non-epileptic events however it is unclear if events have truly been captured in the past. Per patient spells have been captured in the past but were attributed to stress/anxiety related to her (at the time) ill father.  - Seizure precautions, Aspiration precautions  - Telemetry, Neuro checks Q4, Vitals.  - Continue vEEG with goal of spell capture   - Plan for 24 hours. If seizure free will remove in the AM  - TSH - 21.417, Free T4 - 0.82  - MRI brain w/wo contrast deferred for now due to semiology of spell  - Please avoid epileptogenic medications like imipenem, Ultram ,Webutrin ,Quinolones  - Per [place] law: Patient was advised to not drive motorized or nonmotorized vehicle for 6 months since last seizure or unexplained loss of consciousness. Advised not to engage in unsupervised activity which can pose risk of personal injury including heavy machinery work, operating in heights, bathing in bathtub or swimming unsupervised.      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 Continue SSIP         ___    DVT/PE Prophylaxis - Enoxaparin  Consults - none  Hardware (Lines, Drains, Foley, Tubes) - PIV  Diet - Diabetic and Cardiac  Activity - Up w/ assistance and Up in chair TID w/ all meals  Therapy - None    Labs/Diagnostic studies ordered: N/A      Tana Felts, MD  09/01/2020, 06:41  PGY-2 Neurology      I saw and examined the patient.  I reviewed the resident's note.  I agree with the findings and plan of care as documented in the resident's note.  Any exceptions/additions are edited/noted.    Georgina Peer, MD

## 2020-09-01 NOTE — Care Management Notes (Signed)
[facility]  Care Management Note    Patient Name: Marie Vasquez  Date of Birth: 1991/03/26  Sex: female  Date/Time of Admission: 08/31/2020  6:59 AM  Room/Bed: 28/A  Payor: Advertising copywriter / Plan: UNITED HEALTHCARE CHOICE PLUS / Product Type: Non Managed Care /    LOS: 1 day   Primary Care Providers:  Merlene Morse, MD, MD (General)    Admitting Diagnosis:  Seizure (CMS [facility]) [R56.9]  Seizure-like activity (CMS [facility]) [R56.9]    Assessment:      09/01/20 1128   Assessment Details   Assessment Type Admission   Date of Care Management Update 09/01/20   Date of Next DCP Update 09/04/20   Readmission   Is this a readmission? No   Insurance Information/Type   Insurance type Commercial   Employment/Financial   Patient has Prescription Coverage?  Yes        Name of Insurance Coverage for Medications Restaurant manager, fast food Concerns none   Living Environment   Select an age group to open "lives with" row.  Adult   Lives With alone   Living Arrangements house   Able to Return to Prior Arrangements yes   Care Management Plan   Discharge Planning Status initial meeting   Projected Discharge Date 09/04/20   Discharge plan discussed with: Patient   CM will evaluate for rehabilitation potential yes   Patient choice offered to patient/family no   Form for patient choice reviewed/signed and on chart no   Patient aware of possible cost for ambulance transport?  No   Discharge Needs Assessment   Equipment Currently Used at [facility] glucometer;nebulizer   Equipment Needed After Discharge none   Discharge Facility/Level of Care Needs Home (Patient/Family Member/other)(code 1)   Transportation Available car;family or friend will provide   Referral Information   Admission Type inpatient   Address Verified verified-no changes   Arrived From acute hospital, other   Acute Care Facility Other - See Comments  ([place])   ADVANCE DIRECTIVES   Does the Patient have an Advance Directive? No, Information Offered and  Refused   Patient Requests Assistance in Having Advance Directive Notarized. N/A   LAY CAREGIVER    Appointed Lay Caregiver? I Decline   30 year old female admitted from [place] with seizure. See H&P for PMH. This admission, patient ordered labs, imaging, neurological/vital sign checks, telemetry, vEEG. I introduced myself and explained role of care management. CCC contact information placed on patient white board.    Discharge Plan:  Home (Patient/Family Member/other) (code 1)  Patient lives alone in 2 story house with 2 STE ([address], [place], [place] [address]). Family able to provide assistance after discharge. Patient uses no HH and has DME at home (glucometer, nebulizer). Patient demographics, PCP, pharmacy, and medical/prescription drug coverage verified. Patient able to afford copays and manages medications. MPOA/Living Will declined. Laycaregiver declined. Anticipated discharge to home once medically cleared. Family to provide transportation at time of discharge.     The patient will continue to be evaluated for developing discharge needs.     Case Manager: Karma Lew, CLINICAL CARE COORDINATOR  Phone: [phone]

## 2020-09-01 NOTE — Procedures (Addendum)
Continuous Video EEG Report    Marie Vasquez - 30 y.o. female   Attending provider : Georgina Peer, MD    Indication: sPell    EEG # : [id number]  Acquisition : digitally acquired video EEG  Headset : international 10-20 system  Technician :  CH      Reporting period 2 : 09/01/20 @6 :48pm to 09/02/2020@9 :21am    INTERPRETATION    Normal awake, drowsy and asleep EEG. A normal EEG does not rule out epilepsy; however, there was no evidence of an underlying seizure tendency on this recording.     No patient events were marked.    Clinical correlation required.    09/04/2020, MD  PGY-2 Neurology  Pager [phone]      I personally reviewed the EEG with the resident and agree with findings noted above.   Any exceptions/additions are edited/noted.    #[id number], MD  Assistant Professor, [facility] medicine  Pediatric Neurology and Epilepsy       DESCRIPTION    Background  Posterior dominant rhythm :  9 Hz  Architecture : symmetric, continuous, well-organized  Frequency composition : Alpha admixed with beta  State : stage N2 sleep evidenced by sleep spindles    Paroxysmal Findings  None    Seizures and Spells  None    Activation procedures  Photic stimulation: not performed  Hyperventilation: not performed    ---------------------------------------------------------------------    Reporting period 1 : 08/31/20 @6 :48pm to 09/01/2020@6 .48pm    INTERPRETATION    Normal awake, drowsy and asleep EEG. A normal EEG does not rule out epilepsy ; however, there was no evidence of an underlying seizure tendency on this recording.     No patient events were marked.    Clinical correlation required.    , MD  09/01/2020        I personally reviewed the EEG with the resident and agree with findings noted above.   Any exceptions/additions are edited/noted.    Junious Silk, MD  Assistant Professor, [facility] medicine  Pediatric Neurology and Epilepsy        DESCRIPTION    Background  Posterior dominant rhythm :  9  Hz  Architecture : symmetric, continuous, well-organized  Frequency composition : Alpha admixed with beta  State : stage N2 sleep evidenced by sleep spindles    Paroxysmal Findings  None    Seizures and Spells  None    Activation procedures  Photic stimulation: not performed  Hyperventilation: not performed

## 2020-09-02 LAB — POC BLOOD GLUCOSE (RESULTS)
GLUCOSE, POC: 149 mg/dl — ABNORMAL HIGH (ref 70–105)
GLUCOSE, POC: 149 mg/dl — ABNORMAL HIGH (ref 70–105)

## 2020-09-02 LAB — URINE CULTURE
URINE CULTURE: 1000 — AB
URINE CULTURE: >100000

## 2020-09-02 NOTE — Discharge Summary (Signed)
[facility]  DISCHARGE SUMMARY    PATIENT NAME:  Marie Vasquez, Marie Vasquez  MRN:  [id number]  DOB:  Sep 07, 1990    ENCOUNTER DATE:  08/31/2020  INPATIENT ADMISSION DATE: 08/31/2020  DISCHARGE DATE:  09/02/2020    ATTENDING PHYSICIAN: Gemma Payor, MD  SERVICE: NEUROLOGY 1  PRIMARY CARE PHYSICIAN: Leland Johns, MD         LAY CAREGIVER:  ,  ,        PRIMARY DISCHARGE DIAGNOSIS:    Active Hospital Problems    Diagnosis Date Noted    Seizure (CMS [facility]) [R56.9] 08/31/2020    Seizure-like activity (CMS [facility]) [R56.9] 08/31/2020      Resolved Hospital Problems   No resolved problems to display.     There are no active non-hospital problems to display for this patient.       DISCHARGE MEDICATIONS:     Current Discharge Medication List        CONTINUE these medications - NO CHANGES were made during your visit.        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  Refills: 0     cetirizine 10 mg Tablet  Commonly known as: ZYRTEC   10 mg, Oral  Refills: 0     escitalopram oxalate 20 mg Tablet  Commonly known as: LEXAPRO   20 mg, Oral  Refills: 0     fluticasone propionate 50 mcg/actuation Spray, Suspension  Commonly known as: FLONASE   2 Sprays, Nasal  Refills: 0     Insulin Needles (Disposable) 31 gauge x 5/16" Needle   Use as directed with Lantus daily  Refills: 0     levothyroxine 150 mcg Tablet  Commonly known as: SYNTHROID   150 mcg, Oral  Refills: 0     Melatonin 5 mg Capsule   1 Capsule, Oral  Refills: 0     MetFORMIN 1,000 mg Tablet  Commonly known as: GLUCOPHAGE   1,000 mg, Oral  Refills: 0     metoclopramide HCl 5 mg Tablet  Commonly  known as: REGLAN   5 mg, Oral  Refills: 0     pantoprazole 40 mg Tablet, Delayed Release (E.C.)  Commonly known as: PROTONIX   40 mg, Oral  Refills: 0     rosuvastatin 5 mg Tablet  Commonly known as: CRESTOR   5 mg, Oral  Refills: 0     Trulicity 6.75 FF/6.3 mL Pen Injector  Generic drug: dulaglutide   0.75 mg, Subcutaneous, EVERY 7 DAYS  Refills: 0            STOP taking these medications.      ondansetron 4 mg Tablet, Rapid Dissolve  Commonly known as: ZOFRAN ODT     traZODone 50 mg Tablet  Commonly known as: DESYREL            Discharge med list refreshed?  YES  ALLERGIES:  Allergies   Allergen Reactions    Amoxicillin-Pot Clavulanate Hives/ Urticaria, Itching and Rash    Pseudoephedrine Hcl Hives/ Urticaria             HOSPITAL PROCEDURE(S):   Bedside Procedures:  No orders of the defined types were placed in this encounter.    Surgical       REASON FOR HOSPITALIZATION AND HOSPITAL COURSE     BRIEF HPI:  This is a 30 y.o., female admitted for seizure like activity. She had been driving to work when she became tachycardic with rate peaking in the 190's. She had taken her AM atenolol and took a second dose when HR reached 120's. She arrived to the [facility] ED and was taken inside. Tachycardia broke at that time. She then developed chest tightness and became tachypneic followed by dizziness then followed by seeing spots and loss of consciousness. She desaturated to the 60's briefly. Per ED staff she had "generalized twitching". Cardiac work-up at the outside facility was unremarkable so she was transferred for seizure evaluation. 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 She has then been seizure free without AED until this reported activity. She does describe an episode before where she closed her eyes and  stopped speaking however the patient does not feel this was a seizure/spell.    BRIEF HOSPITAL NARRATIVE:      Patient was admitted and connected to video EEG. Laboratory work-up was unremarkable. MRI was deferred due to described semiology being more concerning for cardiogenic etiology. EEG was normal awake and asleep without spell capture. This was stopped on the morning of discharge. She was NOT started on anti-seizures medications. [place] driving restrictions were explained as well as seizure safety (caution with bodies of water, ladders, and stairs). She verbalized understanding of [place] seizure restrictions. She was discharged home in stable condition with follow-up ordered.     TRANSITION/POST DISCHARGE CARE/PENDING TESTS/REFERRALS:   - Follow up with [facility] Neurology ordered for 3 months   - Will see Dr. 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         CONDITION ON DISCHARGE:  A. Ambulation: Full ambulation  B. Self-care Ability: Complete  C. Cognitive Status Alert and Oriented x 3  D. Code status at discharge:              LINES/DRAINS/WOUNDS AT DISCHARGE:   Patient Lines/Drains/Airways Status       Active Line / Dialysis Catheter / Dialysis Graft / Drain / Airway / Wound       None                    DISCHARGE DISPOSITION:  Home discharge      NEUROLOGY RISK FACTORS:  -None of the following conditions apply        DISCHARGE INSTRUCTIONS:   Follow-up Information       Neurology, [place] .    Specialty: Neurology  Contact information:  [address]  [place] [address]  [phone]  Additional information:  Your Health is our  Highest Priority* Valet Services are currently suspended due to COVID-19 restrictions at this [place] outpatient clinic. We  apologize for any inconvenience this may cause. Please ask an attendant for assistance if needed.                            DISCHARGE INSTRUCTION - IMPORTANT INFORMATION    GENERAL INFORMATION: Contact the neurology office for questions/concerns Monday-Friday 7 a.m. - 5 p.m. at [phone].     DISCHARGE INSTRUCTION - MISC    Resume your home medications. Please contact the [facility] neurology clinic at phone [phone] or Fax [phone] for Dr. Jeanne Ivan if you need paperwork for work.     FOLLOW-UP: [place], [place]     Follow-up in: OTHER    Other, Please Specify: 3 months    Reason for visit: HOSPITAL DISCHARGE    Follow-up reason: seizure like activity    Provider: Chipper Oman, MD    Copies sent to Care Team         Relationship Specialty Notifications Start End    Leland Johns, MD PCP - General EXTERNAL  05/22/20     Phone: [phone] Fax: [place] [phone] [place] [phone]              Referring providers can utilize [email or web address] to access their referred [place] patient's information.              Gemma Payor, MD

## 2020-09-02 NOTE — Nurses Notes (Signed)
Patient discharged home with family.  AVS reviewed with patient/care giver.  A written copy of the AVS and discharge instructions was given to the patient/care giver.  Questions sufficiently answered as needed.  Patient/care giver encouraged to follow up with PCP as indicated.  In the event of an emergency, patient/care giver instructed to call 911 or go to the nearest emergency room.

## 2020-09-02 NOTE — Progress Notes (Signed)
[facility]  Neurology Progress Note      Chante, Mayson, 30 y.o. female  Date of Admission:  08/31/2020  Date of service: 09/02/2020  Date of Birth:  04-Aug-1990      Chief Complaint: Seizure like activity, desaturation  Pt's condition today: stable    Subjective: No events overnight. No push-button events. She is hopeful for discharge. Her mother is working a half day and will pick her up if discharged. She is agreeable to follow-up with me in the clinic.    Vital Signs:  Temp (24hrs) Max:37.1 C (98.8 F)      Systolic (24hrs), Avg:116 , Min:101 , Max:127     Diastolic (24hrs), Avg:77, Min:69, Max:88    Temp  Avg: 36.8 C (98.2 F)  Min: 36.6 C (97.9 F)  Max: 37.1 C (98.8 F)  MAP (Non-Invasive)  Avg: 90.6 mmHG  Min: 81 mmHG  Max: 100 mmHG  Pulse  Avg: 79.3  Min: 71  Max: 91  Resp  Avg: 18  Min: 18  Max: 18  SpO2  Avg: 95.8 %  Min: 95 %  Max: 97 %       Today's Physical Exam:  General:  alert  Mental status:  Alert and oriented x 3  Memory:  Registration, Recall, and Following of commands is normal  Attention:  Attention and Concentration are normal  Knowledge:  Good  Language and Speech:  Normal and Normal  Cranial nerves:   Cranial nerves 2-12 are normal  Muscle tone:  WNL  Motor strength:  Motor strength is normal throughout.  Sensory:  Sensory exam in the upper and lower extremities is normal  Gait:  unable to assess. Reason: Connected to EEG  Coordination:  Coordination is normal without tremor  Reflexes:  Reflexes are 2/2 throughout    Current Medications:  acetaminophen (TYLENOL) tablet, 650 mg, Oral, Q4H PRN  cetirizine (ZYRTEC) tablet, 10 mg, Oral, Daily  enoxaparin PF (LOVENOX) 40 mg/0.4 mL SubQ injection, 40 mg, Subcutaneous, Q24H  escitalopram (LEXAPRO) tablet, 20 mg, Oral, NIGHTLY  famotidine (PEPCID) tablet, 20 mg, Oral, 2x/day  levothyroxine (SYNTHROID) tablet, 150 mcg, Oral, QAM  sennosides-docusate sodium (SENOKOT-S) 8.6-50mg  per tablet, 1 Tablet, Oral, 2x/day PRN  SSIP insulin  lispro 100 units/mL injection, 0-12 Units, Subcutaneous, 4x/day PRN      I/O:  I/O last 24 hours:      Intake/Output Summary (Last 24 hours) at 09/02/2020 0716  Last data filed at 09/02/2020 0519  Gross per 24 hour   Intake 540 ml   Output 1400 ml   Net -860 ml     I/O current shift:  No intake/output data recorded.  Last BM: PTA    Labs  Please indicate ordered or reviewed)  Reviewed: I have reviewed all prior lab results. No new labs today.    Review of reports and notes reveal:   CT Brain WO: Dated 08/31/20: per radiology report:  IMPRESSION:  No specific abnormality identified.    Independent Interpretation of images or specimens:  CT Brain WO: Dated 08/31/20: per my review:  No hemorrhage. Gray white differentiation is intact. No acute intracranial abnormality.     Patient/ Family Discussion: Updated patient at bedside    Assessment/Plan:  Active Hospital Problems    Diagnosis    Seizure (CMS [facility])    Seizure-like activity (CMS [facility])     Seizure like activity  Etiology: Suspect this spell was cardiogenic in nature given the report of tachycardia and tachypnea  followed by dizziness and vision change. Reported history of non-epileptic events however it is unclear if events have truly been captured in the past. Per patient spells have been captured in the past but were attributed to stress/anxiety related to her (at the time) ill father.  - Seizure precautions, [place] precautions  - Telemetry, Neuro checks Q4, Vitals.  - Continue vEEG with goal of spell capture   - Will remove this AM if no seizure or epileptiform discharges  - TSH - 21.417, Free T4 - 0.82  - MRI brain w/wo contrast deferred for now due to semiology of spell  - Please avoid epileptogenic medications like imipenem, Ultram ,Webutrin ,Quinolones  - Per [place] law: Patient was advised to not drive motorized or nonmotorized vehicle for 6 months since last seizure or unexplained loss of consciousness. Advised not to engage in unsupervised activity which can  pose risk of personal injury including heavy machinery work, operating in heights, bathing in bathtub or swimming unsupervised.     Likely discharge home today. Will have patient follow up in clinic. Can plan for possible ambulatory EEG or EMU admission in the future but will decide in the outpatient setting.     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 Continue SSIP         ___    DVT/PE Prophylaxis - Enoxaparin  Consults - none  Hardware (Lines, Drains, Foley, Tubes) - PIV  Diet - Diabetic and Cardiac  Activity - Up w/ assistance and Up in chair TID w/ all meals  Therapy - None    Labs/Diagnostic studies ordered: N/A      Tana Felts, MD  09/02/2020, 05:58  PGY-2 Neurology      I saw and examined the patient.  I reviewed the resident's note.  I agree with the findings and plan of care as documented in the resident's note.  Any exceptions/additions are edited/noted.    Georgina Peer, MD

## 2020-09-02 NOTE — Care Management Notes (Signed)
[facility]  Care Management Note    Patient Name: Marie Vasquez  Date of Birth: 1990/09/10  Sex: female  Date/Time of Admission: 08/31/2020  6:59 AM  Room/Bed: 28/A  Payor: Advertising copywriter / Plan: UNITED HEALTHCARE CHOICE PLUS / Product Type: Non Managed Care /    LOS: 2 days   Primary Care Providers:  Merlene Morse, MD, MD (General)    Admitting Diagnosis:  Seizure (CMS [facility]) [R56.9]  Seizure-like activity (CMS [facility]) [R56.9]    Assessment:      09/02/20 1335   Assessment Details   Assessment Type Continued Assessment   Date of Care Management Update 09/02/20   Date of Next DCP Update 09/04/20   Care Management Plan   Discharge Planning Status discharge plan complete   Projected Discharge Date 09/02/20   Discharge plan discussed with: Patient   CM will evaluate for rehabilitation potential yes   Patient choice offered to patient/family no   Form for patient choice reviewed/signed and on chart no   Patient aware of possible cost for ambulance transport?  No   Discharge Needs Assessment   Equipment Currently Used at Home   (see initial assessment)   Equipment Needed After Discharge none   Discharge Facility/Level of Care Needs Home (Patient/Family Member/other)(code 1)   Transportation Available car;family or friend will provide     Discharge Plan:  Home (Patient/Family Member/other) (code 1)  Patient admitted with seizure. Per service, patient medically cleared for D/C to home. No CM needs. Anticipated discharge to home. Family to provide transportation at time of discharge.     The patient will continue to be evaluated for developing discharge needs.     Case Manager: Karma Lew, CLINICAL CARE COORDINATOR  Phone: [phone]

## 2020-09-29 ENCOUNTER — Other Ambulatory Visit (HOSPITAL_COMMUNITY): Payer: Self-pay | Admitting: Geriatric Medicine

## 2020-09-29 DIAGNOSIS — R42 Dizziness and giddiness: Secondary | ICD-10-CM

## 2021-01-27 ENCOUNTER — Other Ambulatory Visit: Payer: Self-pay

## 2021-01-27 ENCOUNTER — Encounter (HOSPITAL_COMMUNITY): Payer: Self-pay

## 2021-01-27 ENCOUNTER — Emergency Department
Admission: EM | Admit: 2021-01-27 | Discharge: 2021-01-27 | Disposition: A | Discharge status: Home / Self Care | Payer: Medicare (Managed Care) | Attending: Emergency Medicine | Admitting: Emergency Medicine

## 2021-01-27 DIAGNOSIS — R739 Hyperglycemia, unspecified: Secondary | ICD-10-CM

## 2021-01-27 DIAGNOSIS — Z20822 Contact with and (suspected) exposure to covid-19: Secondary | ICD-10-CM | POA: Insufficient documentation

## 2021-01-27 DIAGNOSIS — R112 Nausea with vomiting, unspecified: Secondary | ICD-10-CM | POA: Insufficient documentation

## 2021-01-27 DIAGNOSIS — E1165 Type 2 diabetes mellitus with hyperglycemia: Secondary | ICD-10-CM | POA: Insufficient documentation

## 2021-01-27 DIAGNOSIS — Z7984 Long term (current) use of oral hypoglycemic drugs: Secondary | ICD-10-CM

## 2021-01-27 LAB — URINALYSIS, MACROSCOPIC
BILIRUBIN: NEGATIVE mg/dL
COLOR: NORMAL
GLUCOSE: >=500 mg/dL — AB
KETONES: NEGATIVE mg/dL
LEUKOCYTES: NEGATIVE WBCs/uL
NITRITE: NEGATIVE
PH: 6 (ref 5.0–8.0)
PROTEIN: 30 mg/dL — AB
SPECIFIC GRAVITY: 1.025 (ref 1.005–1.030)
UROBILINOGEN: NEGATIVE mg/dL

## 2021-01-27 LAB — VENOUS BLOOD GAS
%FIO2 (VENOUS): 21 %
BASE EXCESS: 2.3 mmol/L (ref <=3.0)
BICARBONATE (VENOUS): 26.4 mmol/L — ABNORMAL HIGH (ref 22.0–26.0)
PCO2 (VENOUS): 39 mm/Hg — ABNORMAL LOW (ref 41–51)
PH (VENOUS): 7.44 — ABNORMAL HIGH (ref 7.31–7.41)
PO2 (VENOUS): 47 mm/Hg (ref 35–50)

## 2021-01-27 LAB — CBC WITH DIFF
BASOPHIL #: 0.04 10*3/uL (ref <=0.20)
BASOPHIL %: 0 %
EOSINOPHIL #: 0.24 10*3/uL (ref <=0.50)
EOSINOPHIL %: 3 %
HCT: 45.7 % (ref 34.8–46.0)
HGB: 15.1 g/dL (ref 11.5–16.0)
IMMATURE GRANULOCYTE #: <0.04 10*3/uL (ref <0.10)
IMMATURE GRANULOCYTE %: 0 % (ref 0–1)
LYMPHOCYTE #: 3.38 10*3/uL (ref 1.00–4.80)
LYMPHOCYTE %: 35 %
MCH: 30.4 pg (ref 26.0–32.0)
MCHC: 33 g/dL (ref 31.0–35.5)
MCV: 92.1 fL (ref 78.0–100.0)
MONOCYTE #: 0.63 10*3/uL (ref 0.20–1.10)
MONOCYTE %: 7 %
MPV: 10.3 fL (ref 8.7–12.5)
NEUTROPHIL #: 5.4 10*3/uL (ref 1.50–7.70)
NEUTROPHIL %: 55 %
PLATELETS: 286 10*3/uL (ref 150–400)
RBC: 4.96 10*6/uL (ref 3.85–5.22)
RDW-CV: 12.6 % (ref 11.5–15.5)
WBC: 9.7 10*3/uL

## 2021-01-27 LAB — BASIC METABOLIC PANEL
ANION GAP: 6 mmol/L (ref 4–13)
BUN/CREA RATIO: 13 (ref 6–22)
BUN: 10 mg/dL (ref 8–25)
CALCIUM: 9.3 mg/dL (ref 8.5–10.2)
CHLORIDE: 105 mmol/L (ref 96–111)
CO2 TOTAL: 25 mmol/L (ref 22–32)
CREATININE: 0.79 mg/dL (ref 0.49–1.10)
ESTIMATED GFR: >60 mL/min/{1.73_m2} (ref >60)
GLUCOSE: 313 mg/dL — ABNORMAL HIGH (ref 65–125)
POTASSIUM: 3.8 mmol/L (ref 3.5–5.1)
SODIUM: 136 mmol/L (ref 136–145)

## 2021-01-27 LAB — HEPATIC FUNCTION PANEL
ALBUMIN: 3.7 g/dL (ref 3.5–5.0)
ALKALINE PHOSPHATASE: 66 U/L (ref 40–110)
ALT (SGPT): 36 U/L (ref <55)
AST (SGOT): 20 U/L (ref 8–41)
BILIRUBIN DIRECT: 0.2 mg/dL (ref <0.3)
BILIRUBIN TOTAL: 0.2 mg/dL — ABNORMAL LOW (ref 0.3–1.3)
PROTEIN TOTAL: 6.9 g/dL (ref 6.4–8.3)

## 2021-01-27 LAB — LIPASE: LIPASE: 18 U/L (ref 10–80)

## 2021-01-27 LAB — POC BLOOD GLUCOSE (RESULTS)
GLUCOSE, POC: 263 mg/dl — ABNORMAL HIGH (ref 70–105)
GLUCOSE, POC: 268 mg/dl — ABNORMAL HIGH (ref 70–105)

## 2021-01-27 LAB — COVID-19, FLU A/B, RSV RAPID BY PCR
INFLUENZA VIRUS TYPE A: NOT DETECTED
INFLUENZA VIRUS TYPE B: NOT DETECTED
RESPIRATORY SYNCTIAL VIRUS (RSV): NOT DETECTED
SARS-CoV-2: NOT DETECTED

## 2021-01-27 LAB — URINALYSIS, MICROSCOPIC

## 2021-01-27 LAB — MAGNESIUM: MAGNESIUM: 1.7 mg/dL (ref 1.6–2.6)

## 2021-01-27 LAB — HCG, PLASMA OR SERUM QUANTITATIVE, PREGNANCY: HCG QUANTITATIVE PREGNANCY: <1 IU/L (ref <5)

## 2021-01-27 LAB — PHOSPHORUS: PHOSPHORUS: 2.6 mg/dL (ref 2.4–4.7)

## 2021-01-27 MED ORDER — KETOROLAC 30 MG/ML (1 ML) INJECTION SOLUTION
30.0000 mg | INTRAMUSCULAR | Status: DC
Start: 2021-01-27 — End: 2021-01-27
  Administered 2021-01-27: 0 mg via INTRAVENOUS
  Filled 2021-01-27: qty 1

## 2021-01-27 MED ORDER — LACTATED RINGERS IV BOLUS
1000.0000 mL | INJECTION | Status: AC
Start: 2021-01-27 — End: 2021-01-27
  Administered 2021-01-27: 1000 mL via INTRAVENOUS
  Administered 2021-01-27: 0 mL via INTRAVENOUS

## 2021-01-27 MED ORDER — ONDANSETRON 4 MG DISINTEGRATING TABLET
4.0000 mg | ORAL_TABLET | Freq: Three times a day (TID) | ORAL | 0 refills | Status: AC | PRN
Start: 2021-01-27 — End: ?

## 2021-01-27 MED ORDER — ONDANSETRON HCL (PF) 4 MG/2 ML INJECTION SOLUTION
4.0000 mg | INTRAMUSCULAR | Status: AC
Start: 2021-01-27 — End: 2021-01-27
  Administered 2021-01-27: 4 mg via INTRAVENOUS
  Filled 2021-01-27: qty 2

## 2021-01-27 MED ORDER — SODIUM CHLORIDE 0.9 % IV BOLUS
1000.0000 mL | INJECTION | Status: AC
Start: 2021-01-27 — End: 2021-01-27
  Administered 2021-01-27: 0 mL via INTRAVENOUS
  Administered 2021-01-27: 1000 mL via INTRAVENOUS

## 2021-01-27 NOTE — ED Nurses Note (Signed)
Discharge instructions given with follow up orders and prescription education.  Pt shows understanding and ambulatory on exit discharge.

## 2021-01-27 NOTE — ED Provider Notes (Signed)
[facility] Emergency Department           Encounter Diagnoses   Name Primary?   . Hyperglycemia Yes   . Nausea and vomiting        ED Course/Medical Decision Making:  No distress  Presents for evaluation of hyperglycemia  Started overnight  Associated headache, nausea, vomiting, mild loose stools  Denies fevers  Denies abdominal pain  Reports her blood sugar typically becomes elevated when she is ill    Denies cough, or respiratory symptoms  No other acute complaints    Vital signs reviewed, reassuring  Appears well overall  Abdomen soft nontender  Will screen labs, VBG  IV hydration  Symptomatic control    09:05  Blood sugar 260s  No DKA  No vomiting here  appropraite for d/c home  Suspect related to GI illness  Discussed care at home and reasons to return  All questions sought and answered       Consults: none      Disposition: Discharged           Current Discharge Medication List      START taking these medications    Details   ondansetron (ZOFRAN ODT) 4 mg Oral Tablet, Rapid Dissolve Take 1 Tablet (4 mg total) by mouth Every 8 hours as needed for Nausea/Vomiting  Qty: 12 Tablet, Refills: 0             Chief Complaint:  Patient presents with     Chief Complaint   Patient presents with   . Hyperglycemia     Pt states woke up at 0200 and had headache, nausea, vomiting. Pt checked BS and it read "high". On arrival pt FSBS 263. Pt is compliant with DM meds         HPI    Marie Vasquez, date of birth 06/08/1991, is a 30 y.o. female who presents to the Emergency Department for evaluation of hyperglycemia.  Noted this morning when she woke up feeling unwell.  States that her blood sugar typically gets high when she is ill.  Denies fevers.  Endorses nausea, vomiting, headache.  Also some mild loose stools.  No changes to her diabetes medications recently.  Denies localizing abdominal pain.  No other acute complaints.    Review of Systems   Constitutional: Negative for chills and fever.   HENT:  Negative for dental problem and sore throat.    Eyes: Negative for redness and visual disturbance.   Respiratory: Negative for cough and shortness of breath.    Cardiovascular: Negative for chest pain and leg swelling.   Gastrointestinal: Positive for diarrhea, nausea and vomiting. Negative for abdominal pain.   Endocrine: Negative for polydipsia and polyuria.   Genitourinary: Negative for dysuria and hematuria.   Musculoskeletal: Negative for back pain and neck pain.   Skin: Negative for color change and rash.   Neurological: Positive for headaches. Negative for dizziness and weakness.   Psychiatric/Behavioral: Negative for suicidal ideas. The patient is not nervous/anxious.          Physical Exam  Vitals and nursing note reviewed.   Constitutional:       General: She is not in acute distress.     Appearance: She is well-developed.   HENT:      Head: Normocephalic and atraumatic.   Eyes:      Conjunctiva/sclera: Conjunctivae normal.   Cardiovascular:      Rate and Rhythm: Normal rate and regular rhythm.  Pulses: Normal pulses.   Pulmonary:      Effort: Pulmonary effort is normal. No respiratory distress.   Abdominal:      General: There is no distension.      Palpations: Abdomen is soft.      Tenderness: There is no abdominal tenderness. There is no guarding.   Musculoskeletal:         General: No deformity. Normal range of motion.      Cervical back: Normal range of motion and neck supple.   Skin:     General: Skin is warm and dry.      Capillary Refill: Capillary refill takes less than 2 seconds.   Neurological:      General: No focal deficit present.      Mental Status: She is alert and oriented to person, place, and time. Mental status is at baseline.      Motor: No abnormal muscle tone.         Vitals:  Filed Vitals:    01/27/21 0649   BP: (!) 144/100   Pulse: 95   Resp: 16   Temp: 36 C (96.8 F)   SpO2: 98%       Past Medical History:  Diagnosis     Past Medical History:   Diagnosis Date   . Asthma    .  Diabetes mellitus, type 2 (CMS HCC)    . Factor II deficiency (CMS HCC)    . HTN (hypertension)    . Hypothyroidism    . PCOS (polycystic ovarian syndrome)    . SVT (supraventricular tachycardia) (CMS HCC)        Past Surgical History:  Past Surgical History:   Procedure Laterality Date   . Hx cholecystectomy         Family History: No family history on file.    Social History     Social History     Tobacco Use   . Smoking status: Never Smoker   . Smokeless tobacco: Never Used   Vaping Use   . Vaping Use: Never used   Substance Use Topics   . Alcohol use: Yes     Comment: socially   . Drug use: Never         Social History Main Topics     Social History     Substance and Sexual Activity   Drug Use Never           Allergies   Allergen Reactions   . Amoxicillin-Pot Clavulanate Hives/ Urticaria, Itching and Rash   . Pseudoephedrine Hcl Hives/ Urticaria           Vital Signs:  Pre-disposition vitals  Filed Vitals:    01/27/21 0649   BP: (!) 144/100   Pulse: 95   Resp: 16   Temp: 36 C (96.8 F)   SpO2: 98%       Old records reviewed by me:  none       Diagnostics:    Labs:    Results for orders placed or performed during the hospital encounter of 01/27/21   BASIC METABOLIC PANEL   Result Value Ref Range    SODIUM 136 136 - 145 mmol/L    POTASSIUM 3.8 3.5 - 5.1 mmol/L    CHLORIDE 105 96 - 111 mmol/L    CO2 TOTAL 25 22 - 32 mmol/L    ANION GAP 6 4 - 13 mmol/L    CALCIUM 9.3 8.5 - 10.2 mg/dL    GLUCOSE 295313 (  H) 65 - 125 mg/dL    BUN 10 8 - 25 mg/dL    CREATININE 0.08 6.76 - 1.10 mg/dL    BUN/CREA RATIO 13 6 - 22    ESTIMATED GFR >60 >60 mL/min/1.65m^2   HCG, PLASMA OR SERUM QUANTITATIVE, PREGNANCY   Result Value Ref Range    HCG QUANTITATIVE PREGNANCY <1 <5 IU/L   HEPATIC FUNCTION PANEL   Result Value Ref Range    ALBUMIN 3.7 3.5 - 5.0 g/dL    ALKALINE PHOSPHATASE 66 40 - 110 U/L    ALT (SGPT) 36 <55 U/L    AST (SGOT) 20 8 - 41 U/L    BILIRUBIN TOTAL 0.2 (L) 0.3 - 1.3 mg/dL    BILIRUBIN DIRECT 0.2 <0.3 mg/dL    PROTEIN  TOTAL 6.9 6.4 - 8.3 g/dL   LIPASE   Result Value Ref Range    LIPASE 18 10 - 80 U/L   MAGNESIUM   Result Value Ref Range    MAGNESIUM 1.7 1.6 - 2.6 mg/dL   PHOSPHORUS   Result Value Ref Range    PHOSPHORUS 2.6 2.4 - 4.7 mg/dL   VENOUS BLOOD GAS   Result Value Ref Range    BICARBONATE (VENOUS) 26.4 (H) 22.0 - 26.0 mmol/L    PCO2 (VENOUS) 39 (L) 41 - 51 mm/Hg    PH (VENOUS) 7.44 (H) 7.31 - 7.41    PO2 (VENOUS) 47 35 - 50 mm/Hg    BASE EXCESS 2.3 -3.0 - 3.0 mmol/L    %FIO2 (VENOUS) 21.0 %   COVID-19, FLU A/B, RSV RAPID BY PCR   Result Value Ref Range    SARS-CoV-2 Not Detected Not Detected    INFLUENZA VIRUS TYPE A Not Detected Not Detected    INFLUENZA VIRUS TYPE B Not Detected Not Detected    RESPIRATORY SYNCTIAL VIRUS (RSV) Not Detected Not Detected   CBC WITH DIFF   Result Value Ref Range    WBC 9.7   x10^3/uL    RBC 4.96 3.85 - 5.22 x10^6/uL    HGB 15.1 11.5 - 16.0 g/dL    HCT 19.5 09.3 - 26.7 %    MCV 92.1 78.0 - 100.0 fL    MCH 30.4 26.0 - 32.0 pg    MCHC 33.0 31.0 - 35.5 g/dL    RDW-CV 12.4 58.0 - 99.8 %    PLATELETS 286 150 - 400 x10^3/uL    MPV 10.3 8.7 - 12.5 fL    NEUTROPHIL % 55 %    LYMPHOCYTE % 35 %    MONOCYTE % 7 %    EOSINOPHIL % 3 %    BASOPHIL % 0 %    NEUTROPHIL # 5.40 1.50 - 7.70 x10^3/uL    LYMPHOCYTE # 3.38 1.00 - 4.80 x10^3/uL    MONOCYTE # 0.63 0.20 - 1.10 x10^3/uL    EOSINOPHIL # 0.24 <=0.50 x10^3/uL    BASOPHIL # 0.04 <=0.20 x10^3/uL    IMMATURE GRANULOCYTE % 0 0 - 1 %    IMMATURE GRANULOCYTE # <0.04 <0.10 x10^3/uL   URINALYSIS, MACROSCOPIC   Result Value Ref Range    SPECIFIC GRAVITY 1.025 1.005 - 1.030    GLUCOSE >= 500 (A) Negative, 30  mg/dL    PROTEIN 30  (A) Negative, 10  mg/dL    BILIRUBIN Negative Negative mg/dL    UROBILINOGEN Negative Negative mg/dL    PH 6.0 5.0 - 8.0    BLOOD Moderate (A) Negative mg/dL    KETONES Negative Negative mg/dL  NITRITE Negative Negative    LEUKOCYTES Negative Negative WBCs/uL    APPEARANCE Clear Clear    COLOR Normal (Yellow) Normal (Yellow)    URINALYSIS, MICROSCOPIC   Result Value Ref Range    WBCS 0-2 2-5, 5-10, 0-2, None /hpf    RBCS 10-20 (A) 2-5, 0-2, None /hpf    BACTERIA Occasional or less Occasional or less /hpf    SQUAMOUS EPITHELIAL CELLS Few (A) Occasional or less /lpf    MUCOUS Light (A) None /hpf    HYALINE CASTS 0-2 2-5, 0-2 /lpf   POC BLOOD GLUCOSE (RESULTS)   Result Value Ref Range    GLUCOSE, POC 263 (H) 70 - 105 mg/dl   POC BLOOD GLUCOSE (RESULTS)   Result Value Ref Range    GLUCOSE, POC 268 (H) 70 - 105 mg/dl     Labs reviewed and interpreted by me.    Radiology:           Orders:  Orders Placed This Encounter   . BASIC METABOLIC PANEL   . CBC/DIFF   . HCG, PLASMA OR SERUM QUANTITATIVE, PREGNANCY   . HEPATIC FUNCTION PANEL   . LIPASE   . MAGNESIUM   . PHOSPHORUS   . VENOUS BLOOD GAS   . COVID-19, FLU A/B, RSV RAPID BY PCR   . CBC WITH DIFF   . URINALYSIS, MACROSCOPIC AND MICROSCOPIC W/CULTURE REFLEX   . URINALYSIS, MACROSCOPIC   . URINALYSIS, MICROSCOPIC   . NS bolus infusion 1,000 mL   . ondansetron (ZOFRAN) 2 mg/mL injection   . LR bolus infusion 1,000 mL   . ketorolac (TORADOL) 30 mg/mL injection   . ondansetron (ZOFRAN ODT) 4 mg Oral Tablet, Rapid Dissolve

## 2021-06-24 ENCOUNTER — Other Ambulatory Visit: Payer: Self-pay

## 2021-06-24 ENCOUNTER — Other Ambulatory Visit (HOSPITAL_COMMUNITY)
Admission: RE | Admit: 2021-06-24 | Discharge: 2021-06-24 | Disposition: A | Discharge status: Home / Self Care | Payer: 59 | Source: Ambulatory Visit | Attending: Obstetrics and Gynecology | Admitting: Obstetrics and Gynecology

## 2021-06-24 ENCOUNTER — Ambulatory Visit: Payer: 59 | Admitting: Obstetrics and Gynecology

## 2021-06-24 ENCOUNTER — Encounter: Payer: Self-pay | Admitting: Obstetrics and Gynecology

## 2021-06-24 VITALS — BP 133/92 | HR 111 | Ht 67.0 in | Wt 269.0 lb

## 2021-06-24 DIAGNOSIS — Z01419 Encounter for gynecological examination (general) (routine) without abnormal findings: Secondary | ICD-10-CM | POA: Diagnosis not present

## 2021-06-24 DIAGNOSIS — Z3046 Encounter for surveillance of implantable subdermal contraceptive: Secondary | ICD-10-CM

## 2021-06-24 DIAGNOSIS — Z113 Encounter for screening for infections with a predominantly sexual mode of transmission: Secondary | ICD-10-CM | POA: Insufficient documentation

## 2021-06-24 DIAGNOSIS — Z3040 Encounter for surveillance of contraceptives, unspecified: Secondary | ICD-10-CM

## 2021-06-24 DIAGNOSIS — Z3202 Encounter for pregnancy test, result negative: Secondary | ICD-10-CM

## 2021-06-24 LAB — POCT URINE PREGNANCY: Preg Test, Ur: NEGATIVE

## 2021-06-24 MED ORDER — ETONOGESTREL 68 MG ~~LOC~~ IMPL
68.0000 mg | DRUG_IMPLANT | Freq: Once | SUBCUTANEOUS | Status: AC
  Administered 2021-06-24: 68 mg via SUBCUTANEOUS

## 2021-06-24 NOTE — Patient Instructions (Signed)
Nexplanon Instructions After Insertion   Keep bandage clean and dry for 24 hours   Keep the steri-strips on your arm until they fall off. This usually takes 1-2 wks. If they fall off before 1 week, replace them with the ones we provided you.   May use ice/Tylenol/Ibuprofen for soreness or pain   If you develop fever, drainage or increased warmth from incision site-contact office immediately   

## 2021-06-24 NOTE — Progress Notes (Signed)
GYNECOLOGY OFFICE PROCEDURE NOTE   Ms. Janet Costa is a 31 y.o. G0P0000 here for Nexplanon insertion. No complaints. UPT ordered today.  BP (!) 133/92 (BP Location: Left Arm, Patient Position: Sitting, Cuff Size: Normal)    Pulse (!) 111    Ht 5\' 7"  (1.702 m)    Wt 269 lb (122 kg)    BMI 42.13 kg/m    Results for orders placed or performed in visit on 06/24/21 (from the past 24 hour(s))  POCT urine pregnancy     Status: Normal   Collection Time: 06/24/21  3:01 PM  Result Value Ref Range   Preg Test, Ur Negative Negative      Nexplanon Insertion Procedure Patient identified, informed consent performed, consent signed.   Patient does understand that irregular bleeding is a very common side effect of this medication. She was advised to have backup contraception for one week after placement. Pregnancy test in clinic today was negative.  Appropriate time out taken.  Patient's left arm was prepped and draped in the usual sterile fashion. The ruler used to measure and mark insertion area.  Patient was prepped with alcohol swab and then injected with 3 ml of 1% lidocaine.  She was prepped with betadine, Nexplanon removed from packaging,  Device confirmed in needle, then inserted full length of needle and withdrawn per handbook instructions. Nexplanon was able to palpated in the patient's arm; patient palpated the insert herself. There was minimal blood loss.  Patient insertion site covered with guaze and a pressure bandage to reduce any bruising.  The patient tolerated the procedure well and was given post procedure instructions. Follow-up via My Chart video visit , unless having problems and need to be seen in the office.  Nexplanon Lot#: [id number] / Expiration Date: 01/25/2023  01/27/2023, CNM  06/24/2021 3:32 PM

## 2021-06-24 NOTE — Progress Notes (Signed)
WELL-WOMAN PHYSICAL & PAP Patient name: Janet Costa MRN [id number]  Date of birth: [birth year] Chief Complaint:   Gynecologic Exam, Establish Care, and Contraception (Nexplanon Removal and Reinsertion)  History of Present Illness:   Janet Costa is a 31 y.o. G0P0000 Caucasian female being seen today for a routine well-woman exam.  Current complaints: none  PCP: None      does not desire labs No LMP recorded. Patient has had an implant. The current method of family planning is Nexplanon.  Last pap 08/16/2017. Results were: normal Last mammogram: n/a. Family h/o breast cancer: Yes Last colonoscopy: n/a. Family h/o colorectal cancer: No Review of Systems:   Pertinent items are noted in HPI Denies any headaches, blurred vision, fatigue, shortness of breath, chest pain, abdominal pain, abnormal vaginal discharge/itching/odor/irritation, problems with periods, bowel movements, urination, or intercourse unless otherwise stated above. Pertinent History Reviewed:  Reviewed past medical,surgical, social and family history.  Reviewed problem list, medications and allergies. Physical Assessment:   Vitals:   06/24/21 1443  BP: (!) 133/92  Pulse: (!) 111  Weight: 269 lb (122 kg)  Height: 5\' 7"  (1.702 m)  Body mass index is 42.13 kg/m.        Physical Examination:   General appearance - well appearing, and in no distress  Mental status - alert, oriented to person, place, and time  Psych:  She has a normal mood and affect  Skin - warm and dry, normal color, no suspicious lesions noted  Chest - effort normal, all lung fields clear to auscultation bilaterally  Heart - normal rate and regular rhythm  Neck:  midline trachea, no thyromegaly or nodules  Breasts - breasts appear normal, no suspicious masses, no skin or nipple changes or  axillary nodes  Abdomen - soft, nontender, nondistended, no masses or organomegaly  Pelvic - VULVA: normal appearing vulva with no masses,  tenderness or lesions  VAGINA: normal appearing vagina with normal color and discharge, no lesions  CERVIX: normal appearing cervix without discharge or lesions -- small friability after pap, no CMT  Thin prep pap is done with reflex HR HPV cotesting  UTERUS: uterus is felt to be normal size, shape, consistency and nontender   ADNEXA: No adnexal masses or tenderness noted.  Rectal - deferred  Extremities:  No swelling or varicosities noted  Results for orders placed or performed in visit on 06/24/21 (from the past 24 hour(s))  POCT urine pregnancy   Collection Time: 06/24/21  3:01 PM  Result Value Ref Range   Preg Test, Ur Negative Negative    Assessment & Plan:  1) Well-Woman Exam with Pap  2) Encounter for routine gynecological examination with Papanicolaou smear of cervix - Cytology - PAP( [facility])  3) Encounter for removal and reinsertion of Nexplanon  - POCT urine pregnancy,  - etonogestrel (NEXPLANON) implant 68 mg - See separate documentation  4) Screen for STD (sexually transmitted disease)  - Cervicovaginal ancillary only( [place])  5) Encounter for surveillance of contraceptive device  - See separate documentation  Labs/procedures today: pap, wet prep Nexplanon removal/insertion  Mammogram at age 70 or sooner if problems Colonoscopy at age 68 or sooner if problems  Orders Placed This Encounter  Procedures   POCT urine pregnancy    Meds: No orders of the defined types were placed in this encounter.   Follow-up: Return in about 4 weeks (around 07/22/2021) for Nexplanon 1 month check - MyChart.  09/19/2021 MSN, CNM 06/24/2021 3:29  PM

## 2021-06-24 NOTE — Progress Notes (Signed)
Ms. Kaylani Fromme is a G0P0000 female in the office today for removal of Nexplanon; which was inserted 06/2018. She desires to have it replaced today. She has no complaints today.  BP (!) 133/92 (BP Location: Left Arm, Patient Position: Sitting, Cuff Size: Normal)    Pulse (!) 111    Ht 5\' 7"  (1.702 m)    Wt 269 lb (122 kg)    BMI 42.13 kg/m    Procedure Note: Consent obtained and Time-Out conducted Implant palpated in left upper arm Betadine prep done on area of excision/removal Lidocaine infiltrated into intradermal and subcutaneous space Small 4mm incision made with scalpel Pressure applied to distal end of implant which exposed the tip through incision Tip of implant grasped with hemostat There was some adherence of implant in subcutaneous tissue. Twisting and manipulation freed the implant from the capsule Implant removed Pressure held on incision until bleeding stopped Steristrips applied to incision Pressure dressing applied by RN Patient tolerated procedure well.   Assessment and Plan: Encounter for removal and reinsertion of Nexplanon  - POCT urine pregnancy,  - etonogestrel (NEXPLANON) implant 68 mg   Time spent with patient doing procedure 10 minutes.  3m, CNM  06/24/2021 3:31 PM

## 2021-06-25 ENCOUNTER — Encounter: Payer: Self-pay | Admitting: Obstetrics and Gynecology

## 2021-06-25 LAB — CERVICOVAGINAL ANCILLARY ONLY
Bacterial Vaginitis (gardnerella): POSITIVE — AB
Candida Glabrata: NEGATIVE
Candida Vaginitis: POSITIVE — AB
Chlamydia: NEGATIVE
Comment: NEGATIVE
Comment: NEGATIVE
Comment: NEGATIVE
Comment: NEGATIVE
Comment: NEGATIVE
Comment: NORMAL
Neisseria Gonorrhea: NEGATIVE
Trichomonas: POSITIVE — AB

## 2021-06-25 LAB — CYTOLOGY - PAP
Comment: NEGATIVE
Diagnosis: NEGATIVE
High risk HPV: NEGATIVE

## 2021-06-26 ENCOUNTER — Other Ambulatory Visit: Payer: Self-pay | Admitting: Obstetrics and Gynecology

## 2021-06-26 ENCOUNTER — Encounter: Payer: Self-pay | Admitting: Obstetrics and Gynecology

## 2021-06-26 DIAGNOSIS — B3731 Acute candidiasis of vulva and vagina: Secondary | ICD-10-CM

## 2021-06-26 DIAGNOSIS — A599 Trichomoniasis, unspecified: Secondary | ICD-10-CM

## 2021-06-26 DIAGNOSIS — B9689 Other specified bacterial agents as the cause of diseases classified elsewhere: Secondary | ICD-10-CM

## 2021-06-26 MED ORDER — METRONIDAZOLE 500 MG PO TABS: 500.0000 mg | ORAL_TABLET | Freq: Two times a day (BID) | ORAL | 0 refills | Status: DC

## 2021-06-26 MED ORDER — FLUCONAZOLE 150 MG PO TABS: 150.0000 mg | ORAL_TABLET | ORAL | 1 refills | Status: DC

## 2021-07-26 ENCOUNTER — Other Ambulatory Visit: Payer: Self-pay | Admitting: *Deleted

## 2021-07-26 DIAGNOSIS — B3731 Acute candidiasis of vulva and vagina: Secondary | ICD-10-CM

## 2021-07-26 MED ORDER — FLUCONAZOLE 150 MG PO TABS: 150.0000 mg | ORAL_TABLET | ORAL | 1 refills | Status: DC

## 2021-07-26 NOTE — Progress Notes (Signed)
Still having yeast symptoms.  Derl Barrow, RN

## 2021-07-29 ENCOUNTER — Other Ambulatory Visit: Payer: Self-pay

## 2021-07-29 ENCOUNTER — Observation Stay (HOSPITAL_COMMUNITY): Payer: 59

## 2021-07-29 ENCOUNTER — Observation Stay (HOSPITAL_COMMUNITY)
Admission: EM | Admit: 2021-07-29 | Discharge: 2021-07-31 | Disposition: A | Discharge status: Home / Self Care | Payer: 59 | Attending: Family Medicine | Admitting: Family Medicine

## 2021-07-29 ENCOUNTER — Encounter (HOSPITAL_COMMUNITY): Payer: Self-pay | Admitting: *Deleted

## 2021-07-29 ENCOUNTER — Emergency Department (HOSPITAL_COMMUNITY): Payer: 59

## 2021-07-29 DIAGNOSIS — E039 Hypothyroidism, unspecified: Secondary | ICD-10-CM | POA: Diagnosis present

## 2021-07-29 DIAGNOSIS — Z79890 Hormone replacement therapy (postmenopausal): Secondary | ICD-10-CM

## 2021-07-29 DIAGNOSIS — Z79899 Other long term (current) drug therapy: Secondary | ICD-10-CM | POA: Diagnosis not present

## 2021-07-29 DIAGNOSIS — R112 Nausea with vomiting, unspecified: Secondary | ICD-10-CM

## 2021-07-29 DIAGNOSIS — Z888 Allergy status to other drugs, medicaments and biological substances status: Secondary | ICD-10-CM

## 2021-07-29 DIAGNOSIS — K219 Gastro-esophageal reflux disease without esophagitis: Secondary | ICD-10-CM | POA: Diagnosis not present

## 2021-07-29 DIAGNOSIS — R197 Diarrhea, unspecified: Secondary | ICD-10-CM

## 2021-07-29 DIAGNOSIS — Z7984 Long term (current) use of oral hypoglycemic drugs: Secondary | ICD-10-CM

## 2021-07-29 DIAGNOSIS — E1110 Type 2 diabetes mellitus with ketoacidosis without coma: Principal | ICD-10-CM | POA: Insufficient documentation

## 2021-07-29 DIAGNOSIS — Z8249 Family history of ischemic heart disease and other diseases of the circulatory system: Secondary | ICD-10-CM

## 2021-07-29 DIAGNOSIS — R079 Chest pain, unspecified: Secondary | ICD-10-CM

## 2021-07-29 DIAGNOSIS — Z20822 Contact with and (suspected) exposure to covid-19: Secondary | ICD-10-CM | POA: Insufficient documentation

## 2021-07-29 DIAGNOSIS — Z880 Allergy status to penicillin: Secondary | ICD-10-CM

## 2021-07-29 DIAGNOSIS — F419 Anxiety disorder, unspecified: Secondary | ICD-10-CM

## 2021-07-29 DIAGNOSIS — B3731 Acute candidiasis of vulva and vagina: Secondary | ICD-10-CM

## 2021-07-29 DIAGNOSIS — J45909 Unspecified asthma, uncomplicated: Secondary | ICD-10-CM | POA: Diagnosis present

## 2021-07-29 DIAGNOSIS — R109 Unspecified abdominal pain: Secondary | ICD-10-CM | POA: Diagnosis present

## 2021-07-29 DIAGNOSIS — E282 Polycystic ovarian syndrome: Secondary | ICD-10-CM | POA: Diagnosis present

## 2021-07-29 DIAGNOSIS — R0789 Other chest pain: Secondary | ICD-10-CM | POA: Diagnosis present

## 2021-07-29 DIAGNOSIS — I10 Essential (primary) hypertension: Secondary | ICD-10-CM | POA: Diagnosis not present

## 2021-07-29 DIAGNOSIS — E860 Dehydration: Secondary | ICD-10-CM | POA: Insufficient documentation

## 2021-07-29 DIAGNOSIS — Z9049 Acquired absence of other specified parts of digestive tract: Secondary | ICD-10-CM

## 2021-07-29 DIAGNOSIS — Z833 Family history of diabetes mellitus: Secondary | ICD-10-CM

## 2021-07-29 DIAGNOSIS — R7989 Other specified abnormal findings of blood chemistry: Secondary | ICD-10-CM

## 2021-07-29 LAB — TROPONIN I (HIGH SENSITIVITY)
Troponin I (High Sensitivity): <2 ng/L (ref <18)
Troponin I (High Sensitivity): <2 ng/L (ref <18)

## 2021-07-29 LAB — URINALYSIS, ROUTINE W REFLEX MICROSCOPIC
Bilirubin Urine: NEGATIVE
Glucose, UA: >=500 mg/dL — AB
Hgb urine dipstick: NEGATIVE
Ketones, ur: 80 mg/dL — AB
Leukocytes,Ua: NEGATIVE
Nitrite: NEGATIVE
Protein, ur: NEGATIVE mg/dL
Specific Gravity, Urine: 1.039 — ABNORMAL HIGH (ref 1.005–1.030)
pH: 5 (ref 5.0–8.0)

## 2021-07-29 LAB — RENAL FUNCTION PANEL
Albumin: 4.1 g/dL (ref 3.5–5.0)
Anion gap: 10 (ref 5–15)
BUN: 20 mg/dL (ref 6–20)
CO2: 20 mmol/L — ABNORMAL LOW (ref 22–32)
Calcium: 8.4 mg/dL — ABNORMAL LOW (ref 8.9–10.3)
Chloride: 105 mmol/L (ref 98–111)
Creatinine, Ser: 0.7 mg/dL (ref 0.44–1.00)
GFR, Estimated: >60 mL/min (ref >60)
Glucose, Bld: 147 mg/dL — ABNORMAL HIGH (ref 70–99)
Phosphorus: 2.5 mg/dL (ref 2.5–4.6)
Potassium: 3.8 mmol/L (ref 3.5–5.1)
Sodium: 135 mmol/L (ref 135–145)

## 2021-07-29 LAB — COMPREHENSIVE METABOLIC PANEL
ALT: 83 U/L — ABNORMAL HIGH (ref 0–44)
AST: 51 U/L — ABNORMAL HIGH (ref 15–41)
Albumin: 4.8 g/dL (ref 3.5–5.0)
Alkaline Phosphatase: 61 U/L (ref 38–126)
Anion gap: 17 — ABNORMAL HIGH (ref 5–15)
BUN: 32 mg/dL — ABNORMAL HIGH (ref 6–20)
CO2: 15 mmol/L — ABNORMAL LOW (ref 22–32)
Calcium: 9.2 mg/dL (ref 8.9–10.3)
Chloride: 103 mmol/L (ref 98–111)
Creatinine, Ser: 0.94 mg/dL (ref 0.44–1.00)
GFR, Estimated: >60 mL/min (ref >60)
Glucose, Bld: 197 mg/dL — ABNORMAL HIGH (ref 70–99)
Potassium: 4 mmol/L (ref 3.5–5.1)
Sodium: 135 mmol/L (ref 135–145)
Total Bilirubin: 1.4 mg/dL — ABNORMAL HIGH (ref 0.3–1.2)
Total Protein: 9 g/dL — ABNORMAL HIGH (ref 6.5–8.1)

## 2021-07-29 LAB — BASIC METABOLIC PANEL
Anion gap: 14 (ref 5–15)
BUN: 25 mg/dL — ABNORMAL HIGH (ref 6–20)
CO2: 17 mmol/L — ABNORMAL LOW (ref 22–32)
Calcium: 8.5 mg/dL — ABNORMAL LOW (ref 8.9–10.3)
Chloride: 105 mmol/L (ref 98–111)
Creatinine, Ser: 0.76 mg/dL (ref 0.44–1.00)
GFR, Estimated: >60 mL/min (ref >60)
Glucose, Bld: 127 mg/dL — ABNORMAL HIGH (ref 70–99)
Potassium: 3.7 mmol/L (ref 3.5–5.1)
Sodium: 136 mmol/L (ref 135–145)

## 2021-07-29 LAB — BLOOD GAS, VENOUS
Acid-base deficit: 7.9 mmol/L — ABNORMAL HIGH (ref 0.0–2.0)
Bicarbonate: 16.9 mmol/L — ABNORMAL LOW (ref 20.0–28.0)
O2 Saturation: 80.9 %
Patient temperature: 37
pCO2, Ven: 32 mmHg — ABNORMAL LOW (ref 44–60)
pH, Ven: 7.33 (ref 7.25–7.43)
pO2, Ven: 50 mmHg — ABNORMAL HIGH (ref 32–45)

## 2021-07-29 LAB — CBG MONITORING, ED
Glucose-Capillary: 187 mg/dL — ABNORMAL HIGH (ref 70–99)
Glucose-Capillary: 136 mg/dL — ABNORMAL HIGH (ref 70–99)
Glucose-Capillary: 123 mg/dL — ABNORMAL HIGH (ref 70–99)
Glucose-Capillary: 137 mg/dL — ABNORMAL HIGH (ref 70–99)
Glucose-Capillary: 133 mg/dL — ABNORMAL HIGH (ref 70–99)
Glucose-Capillary: 124 mg/dL — ABNORMAL HIGH (ref 70–99)
Glucose-Capillary: 163 mg/dL — ABNORMAL HIGH (ref 70–99)

## 2021-07-29 LAB — CBC
HCT: 49.2 % — ABNORMAL HIGH (ref 36.0–46.0)
Hemoglobin: 16.5 g/dL — ABNORMAL HIGH (ref 12.0–15.0)
MCH: 30.6 pg (ref 26.0–34.0)
MCHC: 33.5 g/dL (ref 30.0–36.0)
MCV: 91.3 fL (ref 80.0–100.0)
Platelets: 315 10*3/uL (ref 150–400)
RBC: 5.39 MIL/uL — ABNORMAL HIGH (ref 3.87–5.11)
RDW: 12.4 % (ref 11.5–15.5)
WBC: 12.7 10*3/uL — ABNORMAL HIGH (ref 4.0–10.5)
nRBC: 0 % (ref 0.0–0.2)

## 2021-07-29 LAB — HEMOGLOBIN A1C
Hgb A1c MFr Bld: 7.4 % — ABNORMAL HIGH (ref 4.8–5.6)
Mean Plasma Glucose: 165.68 mg/dL

## 2021-07-29 LAB — LIPASE, BLOOD: Lipase: 24 U/L (ref 11–51)

## 2021-07-29 LAB — RESP PANEL BY RT-PCR (FLU A&B, COVID) ARPGX2
Influenza A by PCR: NEGATIVE
Influenza B by PCR: NEGATIVE
SARS Coronavirus 2 by RT PCR: NEGATIVE

## 2021-07-29 LAB — PREGNANCY, URINE: Preg Test, Ur: NEGATIVE

## 2021-07-29 LAB — BETA-HYDROXYBUTYRIC ACID: Beta-Hydroxybutyric Acid: 3.69 mmol/L — ABNORMAL HIGH (ref 0.05–0.27)

## 2021-07-29 LAB — I-STAT BETA HCG BLOOD, ED (MC, WL, AP ONLY): I-stat hCG, quantitative: <5 m[IU]/mL (ref <5)

## 2021-07-29 MED ORDER — METOCLOPRAMIDE HCL 5 MG/ML IJ SOLN: 10.0000 mg | Freq: Three times a day (TID) | INTRAMUSCULAR | Status: DC | PRN

## 2021-07-29 MED ORDER — METOCLOPRAMIDE HCL 5 MG/ML IJ SOLN
10.0000 mg | INTRAMUSCULAR | Status: AC
  Administered 2021-07-29: 10 mg via INTRAVENOUS
  Filled 2021-07-29: qty 2

## 2021-07-29 MED ORDER — METOCLOPRAMIDE HCL 5 MG/ML IJ SOLN
5.0000 mg | Freq: Once | INTRAMUSCULAR | Status: AC
  Administered 2021-07-29: 5 mg via INTRAVENOUS
  Filled 2021-07-29: qty 2

## 2021-07-29 MED ORDER — VITAMIN E 45 MG (100 UNIT) PO CAPS
100.0000 [IU] | ORAL_CAPSULE | Freq: Every day | ORAL | Status: DC
  Administered 2021-07-30 – 2021-07-31 (×2): 100 [IU] via ORAL
  Filled 2021-07-29 (×2): qty 1

## 2021-07-29 MED ORDER — SODIUM CHLORIDE 0.9 % IV BOLUS
1000.0000 mL | Freq: Once | INTRAVENOUS | Status: AC
  Administered 2021-07-29: 1000 mL via INTRAVENOUS

## 2021-07-29 MED ORDER — INSULIN ASPART 100 UNIT/ML IJ SOLN
0.0000 [IU] | Freq: Every day | INTRAMUSCULAR | Status: DC
  Filled 2021-07-29: qty 0.05

## 2021-07-29 MED ORDER — INSULIN ASPART 100 UNIT/ML IJ SOLN
0.0000 [IU] | Freq: Three times a day (TID) | INTRAMUSCULAR | Status: DC
  Administered 2021-07-30 – 2021-07-31 (×2): 2 [IU] via SUBCUTANEOUS
  Filled 2021-07-29: qty 0.15

## 2021-07-29 MED ORDER — PROCHLORPERAZINE EDISYLATE 10 MG/2ML IJ SOLN
10.0000 mg | Freq: Four times a day (QID) | INTRAMUSCULAR | Status: DC | PRN
Start: 2021-07-29 — End: 2021-07-31
  Administered 2021-07-30 – 2021-07-31 (×2): 10 mg via INTRAVENOUS
  Filled 2021-07-29 (×2): qty 2

## 2021-07-29 MED ORDER — PANTOPRAZOLE SODIUM 40 MG IV SOLR
40.0000 mg | Freq: Once | INTRAVENOUS | Status: AC
Start: 2021-07-29 — End: 2021-07-29
  Administered 2021-07-29: 40 mg via INTRAVENOUS
  Filled 2021-07-29: qty 10

## 2021-07-29 MED ORDER — LACTATED RINGERS IV SOLN: INTRAVENOUS | Status: DC

## 2021-07-29 MED ORDER — ESCITALOPRAM OXALATE 20 MG PO TABS
20.0000 mg | ORAL_TABLET | Freq: Every day | ORAL | Status: DC
  Administered 2021-07-30 – 2021-07-31 (×2): 20 mg via ORAL
  Filled 2021-07-29: qty 2
  Filled 2021-07-29: qty 1

## 2021-07-29 MED ORDER — DEXTROSE IN LACTATED RINGERS 5 % IV SOLN
INTRAVENOUS | Status: DC
  Filled 2021-07-29: qty 1000

## 2021-07-29 MED ORDER — ALUM & MAG HYDROXIDE-SIMETH 200-200-20 MG/5ML PO SUSP
30.0000 mL | ORAL | Status: DC | PRN
Start: 2021-07-29 — End: 2021-07-31
  Administered 2021-07-29: 30 mL via ORAL
  Filled 2021-07-29 (×2): qty 30

## 2021-07-29 MED ORDER — INSULIN REGULAR(HUMAN) IN NACL 100-0.9 UT/100ML-% IV SOLN
INTRAVENOUS | Status: DC
  Filled 2021-07-29: qty 100

## 2021-07-29 MED ORDER — ASCORBIC ACID 500 MG PO TABS
500.0000 mg | ORAL_TABLET | Freq: Every day | ORAL | Status: DC
  Administered 2021-07-30 – 2021-07-31 (×2): 500 mg via ORAL
  Filled 2021-07-29 (×2): qty 1

## 2021-07-29 MED ORDER — ATENOLOL 25 MG PO TABS
50.0000 mg | ORAL_TABLET | Freq: Every day | ORAL | Status: DC
  Administered 2021-07-30 – 2021-07-31 (×2): 50 mg via ORAL
  Filled 2021-07-29: qty 1
  Filled 2021-07-29: qty 2

## 2021-07-29 MED ORDER — ENOXAPARIN SODIUM 60 MG/0.6ML IJ SOSY
60.0000 mg | PREFILLED_SYRINGE | INTRAMUSCULAR | Status: DC
  Administered 2021-07-30: 60 mg via SUBCUTANEOUS
  Filled 2021-07-29 (×2): qty 0.6

## 2021-07-29 MED ORDER — INSULIN GLARGINE-YFGN 100 UNIT/ML ~~LOC~~ SOLN
10.0000 [IU] | Freq: Every day | SUBCUTANEOUS | Status: DC
  Administered 2021-07-29 – 2021-07-31 (×3): 10 [IU] via SUBCUTANEOUS
  Filled 2021-07-29 (×3): qty 0.1

## 2021-07-29 MED ORDER — MELATONIN 5 MG PO TABS: 5.0000 mg | ORAL_TABLET | Freq: Every evening | ORAL | Status: DC | PRN

## 2021-07-29 MED ORDER — DEXTROSE 50 % IV SOLN: 0.0000 mL | INTRAVENOUS | Status: DC | PRN

## 2021-07-29 MED ORDER — LEVOTHYROXINE SODIUM 25 MCG PO TABS
175.0000 ug | ORAL_TABLET | Freq: Every day | ORAL | Status: DC
  Administered 2021-07-30 – 2021-07-31 (×2): 175 ug via ORAL
  Filled 2021-07-29 (×2): qty 1

## 2021-07-29 MED ORDER — METOCLOPRAMIDE HCL 10 MG PO TABS: 10.0000 mg | ORAL_TABLET | Freq: Four times a day (QID) | ORAL | 0 refills | Status: AC

## 2021-07-29 MED ORDER — LORATADINE 10 MG PO TABS
10.0000 mg | ORAL_TABLET | Freq: Every day | ORAL | Status: DC
  Administered 2021-07-30 – 2021-07-31 (×2): 10 mg via ORAL
  Filled 2021-07-29 (×2): qty 1

## 2021-07-29 MED ORDER — INSULIN GLARGINE-YFGN 100 UNIT/ML ~~LOC~~ SOLN
10.0000 [IU] | Freq: Every day | SUBCUTANEOUS | Status: DC
  Filled 2021-07-29: qty 0.1

## 2021-07-29 MED ORDER — POTASSIUM CHLORIDE 10 MEQ/100ML IV SOLN
10.0000 meq | INTRAVENOUS | Status: AC
  Administered 2021-07-29: 10 meq via INTRAVENOUS
  Filled 2021-07-29: qty 100

## 2021-07-29 MED ORDER — HYDRALAZINE HCL 20 MG/ML IJ SOLN: 10.0000 mg | Freq: Three times a day (TID) | INTRAMUSCULAR | Status: DC | PRN

## 2021-07-29 NOTE — Progress Notes (Signed)
Inpatient Diabetes Program Recommendations  AACE/ADA: New Consensus Statement on Inpatient Glycemic Control (2015)  Target Ranges:  Prepandial:   less than 140 mg/dL      Peak postprandial:   less than 180 mg/dL (1-2 hours)      Critically ill patients:  140 - 180 mg/dL   Lab Results  Component Value Date   GLUCAP 137 (H) 07/29/2021    Review of Glycemic Control  Latest Reference Range & Units 07/29/21 10:18 07/29/21 11:47 07/29/21 12:37  Glucose-Capillary 70 - 99 mg/dL 409 (H) 811 (H) 914 (H)  (H): Data is abnormally high Diabetes history: Type 2 DM Outpatient Diabetes medications: Trulicity 1.5 mg qwk, Jardiance 25 mg QD, Metformin 1000 mg BID Current orders for Inpatient glycemic control: Novolog 0-15 units TID, Novolog 0-5 units QHS, Semglee 10 units QHS  Inpatient Diabetes Program Recommendations:    A1C pending- Secure chat sent to RN   Of note, patient in DKA- beta hydroxybutyric acid 3.69. Recommend IV insulin. Discussed with Dr Ronaldo Miyamoto. Orders received.  Would NOT recommend Jardiance QD at discharge.   Spoke with patient regarding outpatient diabetes management. Patient has been on Jardiance for close to 3 years and is followed by PCP is [place]. Never been on insulin and has experienced N/V.  Reviewed patient's previous A1c of 8.2% (per patient) and that additional lab pending. Explained what a A1c is and what it measures. Also reviewed goal A1c with patient, importance of good glucose control @ home, and blood sugar goals. Reviewed patho of DM, DKA, DKA in setting of SGLTs, vascular changes and commorbidities.  Patient has used [employer] in the past. Interested in applying another sensor. Had trouble in the past with adhesive.  Had mentioned that she had been trying recently to be more mindful of CHO intake and take better care of herself. Question keto diet?  Discussed recommendation for Jardiance and that she will need to follow up with PCP.  Following.  Thanks, Lujean Rave, MSN, RNC-OB Diabetes Coordinator [phone] (8a-5p)

## 2021-07-29 NOTE — Discharge Instructions (Addendum)
Advised to follow-up with endocrinologist as scheduled. Advised to take metformin as prescribed.

## 2021-07-29 NOTE — ED Notes (Signed)
New 20g peripheral IV started in the left forearm. Pt with 2L NS bolus infusing at this time, per written order. Pt attached to cardiac monitor x2. VSS. Pt A&O x4. PA-C notified regarding pt's request for nausea medication.

## 2021-07-29 NOTE — ED Notes (Signed)
Discussed with Hospitalist Dr. Marylyn Ishihara, pt to receive insulin drip at this time to close anion gap of 17. Will infuse D5 LR given pt's current CBG is 136. Dr. Marylyn Ishihara aware and in agreement.

## 2021-07-29 NOTE — H&P (Addendum)
History and Physical    Patient: Janet Costa SHF:[id number] DOB: 03-Jan-1991 DOA: 07/29/2021 DOS: the patient was seen and examined on 07/29/2021 PCP: Pcp, No  Patient coming from: Home  Chief Complaint:  Chief Complaint  Patient presents with   Abdominal Pain    HPI: Janet Costa is a 31 y.o. female with medical history significant of HTN, hypothyroidism, DM2, anxiety. Presenting with N/V/D. Symptoms started 2 days ago. She woke up feeling nauseous and had several episodes of vomiting throughout the day. She tried her zofran and reglan, but she was unable to keep anything down. She also had several episodes of "green diarrhea". 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 In addition, she has noticed that her glucose as been "all over the place from 60 to 300". She reports that her regimen is metformin, jardiance, and trulicity. When he symptoms didn't resolve, she decided to come to the ED for help. She denies any other aggravating or alleviating factors.     Review of Systems: As mentioned in the history of present illness. All other systems reviewed and are negative. Past Medical History:  Diagnosis Date   Asthma    Diabetes mellitus without complication (HCC)    Hypertension    Hypothyroid    Mental disorder    PCOS (polycystic ovarian syndrome)    Past Surgical History:  Procedure Laterality Date   CHOLECYSTECTOMY  2016   WISDOM TOOTH EXTRACTION     Social History:  reports that she has never smoked. She has never been exposed to tobacco smoke. She has never used smokeless tobacco. She reports current alcohol use. She reports that she does not use drugs.  Allergies  Allergen Reactions   Augmentin [Amoxicillin-Pot Clavulanate] Rash   Dimetapp Night Cold-Congestion [Diphenhydramine-Phenylephrine] Rash    Family History  Problem Relation Age of Onset   Diabetes Father    Hypertension Father    Coronary artery  disease Father    Diabetes Mother    Hypertension Mother     Prior to Admission medications   Medication Sig Start Date End Date Taking? Authorizing Provider  atenolol (TENORMIN) 50 MG tablet Take 50 mg by mouth daily.   Yes [provider]  cetirizine (ZYRTEC) 10 MG tablet Take 10 mg by mouth daily.   Yes [provider]  Dulaglutide (TRULICITY) 1.5 MG/0.5ML SOPN Inject 1.5 mg into the skin every 7 (seven) days. Saturday   Yes [provider]  empagliflozin (JARDIANCE) 25 MG TABS tablet Take 25 mg by mouth daily.   Yes [provider]  escitalopram (LEXAPRO) 20 MG tablet Take 20 mg by mouth daily.   Yes [provider]  levothyroxine (SYNTHROID) 175 MCG tablet Take 175 mcg by mouth daily before breakfast.   Yes [provider]  Melatonin 5 MG CAPS Take 5 mg by mouth at bedtime as needed (for sleep).   Yes [provider]  metFORMIN (GLUCOPHAGE) 1000 MG tablet Take 1,000 mg by mouth 2 (two) times daily with a meal.   Yes [provider]  metoCLOPramide (REGLAN) 10 MG tablet Take 1 tablet (10 mg total) by mouth every 6 (six) hours. 07/29/21  Yes Garlon Hatchet, PA-C  ondansetron (ZOFRAN-ODT) 4 MG disintegrating tablet Take 4 mg by mouth every 8 (eight) hours as needed for nausea or vomiting.   Yes [provider]  pantoprazole (PROTONIX) 40 MG tablet Take 40 mg by mouth daily.  Yes [provider]  vitamin C (ASCORBIC ACID) 500 MG tablet Take 500 mg by mouth daily.   Yes [provider]  Vitamin E 100 units TABS Take 100 Units by mouth daily.   Yes [provider]  albuterol (PROVENTIL) (2.5 MG/3ML) 0.083% nebulizer solution Take 2.5 mg by nebulization every 6 (six) hours as needed for wheezing or shortness of breath. Patient not taking: Reported on 07/29/2021    [provider]  albuterol (VENTOLIN HFA) 108 (90 Base) MCG/ACT inhaler Inhale into the lungs every 4 (four) hours as  needed for wheezing or shortness of breath. Patient not taking: Reported on 07/29/2021    [provider]  fluconazole (DIFLUCAN) 150 MG tablet Take 1 tablet (150 mg total) by mouth every 3 (three) days. Patient not taking: Reported on 07/29/2021 07/26/21   Raelyn Mora, CNM    Physical Exam: Vitals:   07/29/21 0845 07/29/21 0930 07/29/21 0945 07/29/21 1003  BP: 122/86 117/79 115/82   Pulse: 95 93 96   Resp: 17 20 19    Temp:    98.4 F (36.9 C)  TempSrc:    Oral  SpO2: 97% 98% 96%    General: 31 y.o. female resting in bed in NAD Eyes: PERRL, normal sclera ENMT: Nares patent w/o discharge, orophaynx clear, dentition normal, ears w/o discharge/lesions/ulcers Neck: Supple, trachea midline Cardiovascular: RRR, +S1, S2, no m/g/r, equal pulses throughout Respiratory: CTABL, no w/r/r, normal WOB GI: BS+, NDNT, no masses noted, no organomegaly noted MSK: No e/c/c Neuro: A&O x 3, no focal deficits Psyc: Appropriate interaction and affect, calm/cooperative   Data Reviewed:  CO2 15 BUN 32 Scr 0.94 Anion gap 17 AST 51 ALT 83 WBC 12/7 Hgb 16.5  EKG: sinus tach, no st elevations 26 RUQ ab: Diffuse hepatic steatosis. Prior cholecystectomy.  No biliary ductal dilation.  Assessment and Plan: No notes have been filed under this hospital service. Service: Hospitalist Early DKA/HHS DM2 Intractable N/V Dehydration     - place in obs, SDU     - started on DKA protocol in ED; continue for now, follow labs     - if gap closed on rpt lab, we can hold insulin gtt and start SSI; at that point downgrade from SDU to progressive     - she can have non-caloric clears for now     - check A1c     - ?jardiance as our cause or at least contributing? UPDATE: Gap is closed; transition to SSI/SQ insulin, DM coordinator consulted, continue fluids  Chest Pain?     - nursing reported "chest tightness"     - check EKG, trp, CXR   Diarrhea     - reports that she has been exposure to  norovirus through work; check GI panel     - fluids  Hypothyroidism     - resume home regimen  GERD     - protonix if GI screen clear  Anxiety     - resume home regimen  HTN     - resume home regimen as able, PRNs available  Advance Care Planning:   Code Status: FULL  Consults: None  Family Communication: None at bedside  Severity of Illness: The appropriate patient status for this patient is OBSERVATION. Observation status is judged to be reasonable and necessary in order to provide the required intensity of service to ensure the patient's safety. The patient's presenting symptoms, physical exam findings, and initial radiographic and laboratory data in the context of their  medical condition is felt to place them at decreased risk for further clinical deterioration. Furthermore, it is anticipated that the patient will be medically stable for discharge from the hospital within 2 midnights of admission.   Author: Teddy Spike, DO 07/29/2021 10:10 AM  For on call review [email or web address].

## 2021-07-29 NOTE — ED Notes (Signed)
Pt c/o chest tightness. EKG captured and hospitalist paged. Repeat POC BG 123 at this time. This nurse discussed pt status change with Dr. Ronaldo Miyamoto, Hospitalist as well as holding Insulin drip at this time. Pt continues to receive D5LR at 138mL/hr per Hospitalist written and verbal order. No additional recommendations at this time. Will continue to monitor.

## 2021-07-29 NOTE — ED Notes (Signed)
Pharmacy paged to send D5 LR.

## 2021-07-29 NOTE — ED Provider Notes (Signed)
Care handoff received from Sharilyn Sites, PA-C at shift change please see previous provider note for full details of visit.  In short 31 year old female with history including diabetes, PCOS, hypertension, hypothyroidism presented to the ED for nausea vomiting diarrhea x48 hours.  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  She has been taking Zofran and Reglan at home without relief of symptoms.  Patient was given Reglan by previous provider as well as IV fluids.  Labs notable for dehydration.  Plan is to reassess after fluids, possible discharge. Physical Exam  BP 115/82    Pulse 96    Temp 98.4 F (36.9 C) (Oral)    Resp 19    SpO2 96%   Physical Exam Constitutional:      General: She is not in acute distress.    Appearance: Normal appearance. She is well-developed. She is not ill-appearing or diaphoretic.  HENT:     Head: Normocephalic and atraumatic.  Eyes:     General: Vision grossly intact. Gaze aligned appropriately.     Pupils: Pupils are equal, round, and reactive to light.  Neck:     Trachea: Trachea and phonation normal.  Pulmonary:     Effort: Pulmonary effort is normal. No respiratory distress.  Abdominal:     General: There is no distension.     Palpations: Abdomen is soft.     Tenderness: There is generalized abdominal tenderness. There is no guarding or rebound.  Musculoskeletal:        General: Normal range of motion.     Cervical back: Normal range of motion.  Skin:    General: Skin is warm and dry.  Neurological:     Mental Status: She is alert.     GCS: GCS eye subscore is 4. GCS verbal subscore is 5. GCS motor subscore is 6.     Comments: Speech is clear and goal oriented, follows commands Major Cranial nerves without deficit, no facial droop Moves extremities without ataxia, coordination intact  Psychiatric:        Behavior: Behavior normal.    Procedures  Procedures  ED Course / MDM    Medical Decision Making Amount and/or Complexity of Data  Reviewed Labs: ordered.    Details: Urinalysis shows 80 ketones suspected due to dehydration.  Glucose greater than 500 on urinalysis, patient on SGLT2 inhibitor which would explain this.  CBC shows leukocytosis of 12.7.  No anemia or thrombocytopenia.  Hemoglobin of 16.5 likely hemoconcentration due to dehydration.  Urine pregnancy test negative.  CMP shows hyperglycemia 197, decreased bicarb of 15, anion gap of 17, likely due to dehydration however early DKA is possible.  LFTs slightly elevated AST 51, ALT 83 suspect due to viral infection.  COVID and flu test are negative.  Lipase is within normal limits, doubt pancreatitis. Radiology: ordered. ECG/medicine tests: ordered.    Details: EKG ordered for evaluation of QT length prior to additional antiemetics. QTc 442.  Additonally acute ischemic changes.  Interpreted by Dr. Rush Landmark.  Risk Prescription drug management. Decision regarding hospitalization.   Patient reassessed reports still feeling nauseous despite IV antiemetics.  I discussed possible observation admission versus recheck of BMP after 2 L fluid bolus and possible outpatient treatment.  Patient's work-up is most suggestive of significant dehydration at this point however early DKA is a possibility.  Shared decision making made with patient, patient does not feel comfortable managing symptoms at home, we will consult hospitalist to see the patient for further care.  Additionally  my examination patient had minimal abdominal tenderness, he does have a slight leukocytosis.  I discussed CT imaging of the abdomen to evaluate for intra-abdominal infection, shared decision-making was made, patient declined CT imaging at this time which is reasonable as patient's abdominal pain improved over the last day. - 10:10 AM: Discussed case with Dr. Ronaldo Miyamoto, advises to start patient on insulin and DKA order set, hospitalist will come to see the patient. - Patient reevaluated she is resting comfortably in  bed no acute distress.  Patient has been seen and evaluated by hospitalist, patient accepted for admission.  No additional work-up indicated from the ER standpoint at this time   Note: Portions of this report may have been transcribed using voice recognition software. Every effort was made to ensure accuracy; however, inadvertent computerized transcription errors may still be present.   Bill Salinas, PA-C 07/29/21 1034    Tegeler, Canary Brim, MD 07/29/21 [phone]

## 2021-07-29 NOTE — ED Provider Notes (Signed)
[facility]EMERGENCY DEPT Provider Note   CSN: [id number] Arrival date & time: 07/29/21  0435     History  Chief Complaint  Patient presents with   Abdominal Pain    Janet Costa is a 31 y.o. female.  The history is provided by the patient and medical records.  Abdominal Pain Associated symptoms: diarrhea, nausea and vomiting    31 year old female with history of diabetes, PCOS, hypertension, hypothyroidism, presenting to the ED with nausea, vomiting, and diarrhea for the past 48 hours.  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  Has been trying to drink fluids and rest at home.  She has been taking Zofran and Reglan, but still continues vomiting.  Diarrhea has slowed a bit.  She reports some chills but denies any known fever.  She has had recent COVID and flu positive patients.  She has been vaccinated for COVID.  Currently, mostly feeling weak and dehydrated.  Home Medications Prior to Admission medications   Medication Sig Start Date End Date Taking? Authorizing Provider  atenolol (TENORMIN) 50 MG tablet Take 50 mg by mouth daily.   Yes [provider]  cetirizine (ZYRTEC) 10 MG tablet Take 10 mg by mouth daily.   Yes [provider]  Dulaglutide (TRULICITY) 1.5 MG/0.5ML SOPN Inject 1.5 mg into the skin every 7 (seven) days. Saturday   Yes [provider]  empagliflozin (JARDIANCE) 25 MG TABS tablet Take 25 mg by mouth daily.   Yes [provider]  escitalopram (LEXAPRO) 20 MG tablet Take 20 mg by mouth daily.   Yes [provider]  levothyroxine (SYNTHROID) 175 MCG tablet Take 175 mcg by mouth daily before breakfast.   Yes [provider]  Melatonin 5 MG CAPS Take 5 mg by mouth at bedtime as needed (for sleep).   Yes [provider]  metFORMIN (GLUCOPHAGE) 1000 MG tablet Take 1,000 mg by mouth 2 (two) times daily with a meal.   Yes [provider]   ondansetron (ZOFRAN-ODT) 4 MG disintegrating tablet Take 4 mg by mouth every 8 (eight) hours as needed for nausea or vomiting.   Yes [provider]  pantoprazole (PROTONIX) 40 MG tablet Take 40 mg by mouth daily.   Yes [provider]  vitamin C (ASCORBIC ACID) 500 MG tablet Take 500 mg by mouth daily.   Yes [provider]  Vitamin E 100 units TABS Take 100 Units by mouth daily.   Yes [provider]  albuterol (PROVENTIL) (2.5 MG/3ML) 0.083% nebulizer solution Take 2.5 mg by nebulization every 6 (six) hours as needed for wheezing or shortness of breath. Patient not taking: Reported on 07/29/2021    [provider]  albuterol (VENTOLIN HFA) 108 (90 Base) MCG/ACT inhaler Inhale into the lungs every 4 (four) hours as needed for wheezing or shortness of breath. Patient not taking: Reported on 07/29/2021    [provider]  fluconazole (DIFLUCAN) 150 MG tablet Take 1 tablet (150 mg total) by mouth every 3 (three) days. Patient not taking: Reported on 07/29/2021 07/26/21   Raelyn Mora, CNM      Allergies    Augmentin [amoxicillin-pot clavulanate] and Dimetapp night cold-congestion [diphenhydramine-phenylephrine]    Review of Systems   Review of Systems  Gastrointestinal:  Positive for abdominal pain, diarrhea, nausea and vomiting.  All other systems reviewed and are negative.  Physical Exam Updated Vital Signs BP 112/74    Pulse (!) 102  Temp 97.8 F (36.6 C) (Oral)    Resp 18    SpO2 94%   Physical Exam Vitals and nursing note reviewed.  Constitutional:      Appearance: She is well-developed.  HENT:     Head: Normocephalic and atraumatic.  Eyes:     Conjunctiva/sclera: Conjunctivae normal.     Pupils: Pupils are equal, round, and reactive to light.  Cardiovascular:     Rate and Rhythm: Normal rate and regular rhythm.     Heart sounds: Normal heart sounds.  Pulmonary:     Effort: Pulmonary effort is normal.     Breath  sounds: Normal breath sounds.  Abdominal:     General: Bowel sounds are normal.     Palpations: Abdomen is soft.     Tenderness: There is no abdominal tenderness. There is no guarding or rebound.  Musculoskeletal:        General: Normal range of motion.     Cervical back: Normal range of motion.  Skin:    General: Skin is warm and dry.  Neurological:     Mental Status: She is alert and oriented to person, place, and time.    ED Results / Procedures / Treatments   Labs (all labs ordered are listed, but only abnormal results are displayed) Labs Reviewed  COMPREHENSIVE METABOLIC PANEL - Abnormal; Notable for the following components:      Result Value   CO2 15 (*)    Glucose, Bld 197 (*)    BUN 32 (*)    Total Protein 9.0 (*)    AST 51 (*)    ALT 83 (*)    Total Bilirubin 1.4 (*)    Anion gap 17 (*)    All other components within normal limits  CBC - Abnormal; Notable for the following components:   WBC 12.7 (*)    RBC 5.39 (*)    Hemoglobin 16.5 (*)    HCT 49.2 (*)    All other components within normal limits  URINALYSIS, ROUTINE W REFLEX MICROSCOPIC - Abnormal; Notable for the following components:   Specific Gravity, Urine 1.039 (*)    Glucose, UA >=500 (*)    Ketones, ur 80 (*)    Bacteria, UA RARE (*)    All other components within normal limits  CBG MONITORING, ED - Abnormal; Notable for the following components:   Glucose-Capillary 187 (*)    All other components within normal limits  RESP PANEL BY RT-PCR (FLU A&B, COVID) ARPGX2  LIPASE, BLOOD  PREGNANCY, URINE    EKG None  Radiology No results found.  Procedures Procedures    Medications Ordered in ED Medications  sodium chloride 0.9 % bolus 1,000 mL (1,000 mLs Intravenous New Bag/Given 07/29/21 0521)  metoCLOPramide (REGLAN) injection 10 mg (10 mg Intravenous Given 07/29/21 3244)    ED Course/ Medical Decision Making/ A&P                           Medical Decision Making Amount and/or  Complexity of Data Reviewed Labs: ordered.  Risk Prescription drug management.   31 y.o. F here with 48 hours of N/V/D.  She is afebrile, non-toxic.  She is tachycardic and clinically appears dry.  Suspect tachycardia from dehydration.  Abdomen soft, overall benign.  She is s/p cholecystectomy already.  We will plan for labs, IV fluids, Reglan.  6:16 AM Labs as above-- mild electrolyte imbalance consistent with dehydration.  BUN 32, mildly elevated  anion gap at 17.  Bicarb 15 but CBG minimally elevated.  I suspect this is all related to dehydration and not DKA.  Covid/flu negative.  She is feeling better after IV reglan, no further vomiting and is tolerating ice chips.  IVF infusing slowly.  HR improved, now WNL on last re-check.   Anticipate she will be ready for discharge once IVF completed.  Care signed out to oncoming provider for disposition.  Final Clinical Impression(s) / ED Diagnoses Final diagnoses:  Nausea vomiting and diarrhea    Rx / DC Orders ED Discharge Orders          Ordered    metoCLOPramide (REGLAN) 10 MG tablet  Every 6 hours        07/29/21 0617              Garlon Hatchet, PA-C 07/29/21 1497    Dione Booze, MD 07/29/21 [phone]

## 2021-07-29 NOTE — ED Notes (Signed)
Per [employer] recommendation - insulin drip to be started at 0units/hr at this time. Holding insulin drip at this time.

## 2021-07-29 NOTE — ED Notes (Signed)
Verified with Pharmacist, Scott, Insulin and Potassium compatible via IV infusion.

## 2021-07-29 NOTE — ED Triage Notes (Signed)
N/v/d and abdominal pain for 2 days.

## 2021-07-30 DIAGNOSIS — E1110 Type 2 diabetes mellitus with ketoacidosis without coma: Secondary | ICD-10-CM | POA: Diagnosis not present

## 2021-07-30 LAB — CBC
HCT: 39.1 % (ref 36.0–46.0)
Hemoglobin: 13.1 g/dL (ref 12.0–15.0)
MCH: 30.3 pg (ref 26.0–34.0)
MCHC: 33.5 g/dL (ref 30.0–36.0)
MCV: 90.5 fL (ref 80.0–100.0)
Platelets: 232 10*3/uL (ref 150–400)
RBC: 4.32 MIL/uL (ref 3.87–5.11)
RDW: 12.4 % (ref 11.5–15.5)
WBC: 10.4 10*3/uL (ref 4.0–10.5)
nRBC: 0 % (ref 0.0–0.2)

## 2021-07-30 LAB — BASIC METABOLIC PANEL
Anion gap: 11 (ref 5–15)
BUN: 12 mg/dL (ref 6–20)
CO2: 18 mmol/L — ABNORMAL LOW (ref 22–32)
Calcium: 8.5 mg/dL — ABNORMAL LOW (ref 8.9–10.3)
Chloride: 102 mmol/L (ref 98–111)
Creatinine, Ser: 0.59 mg/dL (ref 0.44–1.00)
GFR, Estimated: >60 mL/min (ref >60)
Glucose, Bld: 109 mg/dL — ABNORMAL HIGH (ref 70–99)
Potassium: 3.7 mmol/L (ref 3.5–5.1)
Sodium: 131 mmol/L — ABNORMAL LOW (ref 135–145)

## 2021-07-30 LAB — CBG MONITORING, ED
Glucose-Capillary: 101 mg/dL — ABNORMAL HIGH (ref 70–99)
Glucose-Capillary: 149 mg/dL — ABNORMAL HIGH (ref 70–99)

## 2021-07-30 LAB — GLUCOSE, CAPILLARY
Glucose-Capillary: 117 mg/dL — ABNORMAL HIGH (ref 70–99)
Glucose-Capillary: 158 mg/dL — ABNORMAL HIGH (ref 70–99)

## 2021-07-30 NOTE — Assessment & Plan Note (Signed)
Continue Lexapro

## 2021-07-30 NOTE — ED Notes (Signed)
Patient was given her breakfast tray.

## 2021-07-30 NOTE — Assessment & Plan Note (Signed)
She reports diarrhea has improved.

## 2021-07-30 NOTE — Assessment & Plan Note (Signed)
Continue IV Zofran and Reglan as needed.

## 2021-07-30 NOTE — Progress Notes (Signed)
Inpatient Diabetes Program Recommendations  AACE/ADA: New Consensus Statement on Inpatient Glycemic Control (2015)  Target Ranges:  Prepandial:   less than 140 mg/dL      Peak postprandial:   less than 180 mg/dL (1-2 hours)      Critically ill patients:  140 - 180 mg/dL   Lab Results  Component Value Date   GLUCAP 149 (H) 07/30/2021   HGBA1C 7.4 (H) 07/29/2021    Review of Glycemic Control  Latest Reference Range & Units 07/29/21 14:41 07/29/21 16:57 07/29/21 20:29 07/30/21 08:54 07/30/21 11:45  Glucose-Capillary 70 - 99 mg/dL 846 (H) 659 (H) 935 (H) 101 (H) 149 (H)   Diabetes history: DM 2 Outpatient Diabetes medications:  Trulicity 1.5 mg weekly, Jardiance 25 mg daily, Metformin 1000 mg bid Current orders for Inpatient glycemic control:  Novolog moderate tid with meals and HS Semglee 10 units daily  Inpatient Diabetes Program Recommendations:    Spoke with patient at bedside.  A1C is okay.  Would not continue Jardiance at d/c.  However, I am not sure that she needs insulin either.  Patient wants to see both PCP and endocrinologist here in town (as she is a travel Charity fundraiser).  Recommend close f/u and monitoring at d/c.    Thanks,  Beryl Meager, RN, BC-ADM Inpatient Diabetes Coordinator Pager [phone]  (8a-5p)

## 2021-07-30 NOTE — Assessment & Plan Note (Signed)
Continue pantoprazole. °

## 2021-07-30 NOTE — Hospital Course (Addendum)
This 31 years old female with PMH significant for hypertension, hypothyroidism, type 2 diabetes, anxiety presented in the ED with complaints of nausea, vomiting and diarrhea.She reports her symptoms started 2 days ago,  She was feeling very nauseous after she woke up and started vomiting throughout the day.  She has tried Zofran and Reglan but was unable to tolerate anything down.  She also reports several episodes of green-colored stools. 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  She reports difficulty managing her blood glucose level.  At home she takes metformin,  Jardiance and Trulicity. Patient was admitted for DKA and started on insulin gtt. as per DKA protocol.  She was continued on IV hydration.  Anion gap closed,  insulin gtt. discontinued.  Diabetic coordinator consulted.  Hemoglobin A1c 7.2, reports nausea vomiting and diarrhea has resolved.  Patient feels better want to be discharged.  Patient is being discharged on metformin advised to follow-up with endocrinology.

## 2021-07-30 NOTE — Progress Notes (Signed)
Progress Note   Patient: Janet Costa GYK:[id number] DOB: [birth year] DOA: 07/29/2021     0 DOS: the patient was seen and examined on 07/30/2021   Brief hospital course: This 31 years old female with PMH significant for hypertension, hypothyroidism, type 2 diabetes, anxiety presented in the ED with complaints of nausea vomiting and diarrhea she reports her symptoms started 2 days ago she was feeling very nauseous after she woke up and started vomiting throughout the day.  She has tried Zofran and Reglan but was unable to tolerate anything down.  She also reports several episodes of green-colored stools.  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  She reports difficulty managing her blood glucose level.  At home she takes metformin Jardiance and Trulicity. Patient is admitted for DKA and started on insulin gtt. as per DKA protocol.  Assessment and Plan: * DKA (diabetic ketoacidosis) (HCC)- (present on admission) Patient presented with nausea,  Vomiting,  diarrhea and elevated blood glucose, low bicarb, high anion gap.  Started on DKA protocol. Anion gap closed.  She is transition to subcu insulin. Continue IV hydration, monitor serum electrolytes. Started on clear liquid diet, tolerating well. Continue Semglee 10 units daily, regular insulin sliding scale.  GERD (gastroesophageal reflux disease) Continue pantoprazole.  Anxiety Continue Lexapro.  HTN (hypertension), benign Continue atenolol.  Blood pressure has improved.  Diarrhea She reports diarrhea has improved.  Intractable nausea and vomiting Continue IV Zofran and Reglan as needed.   Hypothyroidism: Continue levothyroxine  Subjective:  Patient was seen and examined at bedside.  Overnight events noted.   Patient reports feeling much better.  She reports diarrhea has resolved but she still feels nauseous, started on clear liquid diet.  Physical Exam: Vitals:   07/30/21 1114 07/30/21  1130 07/30/21 1145 07/30/21 1232  BP:    135/88  Pulse:  92 88 93  Resp:  17 13 15   Temp: 98.8 F (37.1 C)   98.7 F (37.1 C)  TempSrc: Oral   Oral  SpO2:  95% 96% 98%  Weight:       Physical Exam Vitals and nursing note reviewed.  Constitutional:      Appearance: She is well-developed. She is obese.  HENT:     Head: Normocephalic and atraumatic.  Cardiovascular:     Rate and Rhythm: Normal rate and regular rhythm.     Heart sounds: Normal heart sounds.  Pulmonary:     Effort: Pulmonary effort is normal.     Breath sounds: Normal breath sounds.  Abdominal:     General: Abdomen is flat. Bowel sounds are normal.     Palpations: Abdomen is soft.     Tenderness: There is no abdominal tenderness.  Skin:    General: Skin is warm and dry.  Neurological:     General: No focal deficit present.     Mental Status: She is alert and oriented to person, place, and time.  Psychiatric:        Mood and Affect: Mood normal.        Behavior: Behavior normal.     Data Reviewed: I have Reviewed nursing notes, Vitals, and Lab results since pt's last encounter. Pertinent lab results CBC, BMP I have ordered test including CBC BMP I have reviewed the last note from hospitalist,  I have discussed pt's care plan and test results with patient.   Family Communication: No family at bedside  Disposition: Status is: Inpatient Remains inpatient appropriate  because: Admitted for DKA requiring IV insulin.  Now anion gap is closed transition to subcu insulin tolerating well started on clear liquid diet.  Anticipated discharge home tomorrow    Planned Discharge Destination: Home   Time spent: 50 minutes  Author: Cipriano Bunker, MD 07/30/2021 2:23 PM  For on call review [email or web address].

## 2021-07-30 NOTE — Assessment & Plan Note (Signed)
Continue atenolol.  Blood pressure has improved.

## 2021-07-30 NOTE — Assessment & Plan Note (Addendum)
Patient presented with nausea,  Vomiting,  diarrhea and elevated blood glucose, low bicarb, high anion gap.  Started on DKA protocol. Anion gap closed.  She was transitioned to subcu insulin. Continued IV hydration, monitor serum electrolytes. Started on clear liquid diet, tolerating well. Started on Semglee 10 units.  Hemoglobin A1c 7.2 Diabetic coordinator consulted,  recommended patient can be discharged on metformin only.  Nausea, vomiting, diarrhea has resolved.

## 2021-07-31 DIAGNOSIS — E1110 Type 2 diabetes mellitus with ketoacidosis without coma: Secondary | ICD-10-CM | POA: Diagnosis not present

## 2021-07-31 LAB — BASIC METABOLIC PANEL
Anion gap: 7 (ref 5–15)
BUN: 9 mg/dL (ref 6–20)
CO2: 23 mmol/L (ref 22–32)
Calcium: 8.5 mg/dL — ABNORMAL LOW (ref 8.9–10.3)
Chloride: 105 mmol/L (ref 98–111)
Creatinine, Ser: 0.54 mg/dL (ref 0.44–1.00)
GFR, Estimated: >60 mL/min (ref >60)
Glucose, Bld: 119 mg/dL — ABNORMAL HIGH (ref 70–99)
Potassium: 3.5 mmol/L (ref 3.5–5.1)
Sodium: 135 mmol/L (ref 135–145)

## 2021-07-31 LAB — HIV ANTIBODY (ROUTINE TESTING W REFLEX): HIV Screen 4th Generation wRfx: NONREACTIVE

## 2021-07-31 LAB — GLUCOSE, CAPILLARY: Glucose-Capillary: 127 mg/dL — ABNORMAL HIGH (ref 70–99)

## 2021-07-31 MED ORDER — PANTOPRAZOLE SODIUM 40 MG PO TBEC
40.0000 mg | DELAYED_RELEASE_TABLET | Freq: Every day | ORAL | Status: DC
  Administered 2021-07-31: 40 mg via ORAL
  Filled 2021-07-31: qty 1

## 2021-07-31 NOTE — Plan of Care (Signed)

## 2021-07-31 NOTE — Progress Notes (Signed)
AVS given and reviewed with pt. Medications discussed. All questions answered to satisfaction. Pt verbalized understanding of information given. Pt escorted off the unit with all belongings via wheelchair by staff member.  

## 2021-07-31 NOTE — Discharge Summary (Signed)
Physician Discharge Summary   Patient: Janet Costa MRN: [id number] DOB: April 04, 1991  Admit date:     07/29/2021  Discharge date: 07/31/21  Discharge Physician: Cipriano Bunker   PCP: Pcp, No   Recommendations at discharge:   Advised to follow-up with primary care physician in 1 week. Advised to follow-up with endocrinologist as scheduled. Advised to take metformin as prescribed.  Discharge Diagnoses: Principal Problem:   DKA (diabetic ketoacidosis) (HCC) Active Problems:   Intractable nausea and vomiting   Diarrhea   HTN (hypertension), benign   Anxiety   GERD (gastroesophageal reflux disease)   Dehydration   Diabetic ketoacidosis (HCC)  Resolved Problems:   * No resolved hospital problems. [facility] Course: This 31 years old female with PMH significant for hypertension, hypothyroidism, type 2 diabetes, anxiety presented in the ED with complaints of nausea, vomiting and diarrhea.She reports her symptoms started 2 days ago,  She was feeling very nauseous after she woke up and started vomiting throughout the day.  She has tried Zofran and Reglan but was unable to tolerate anything down.  She also reports several episodes of green-colored stools. 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  She reports difficulty managing her blood glucose level.  At home she takes metformin,  Jardiance and Trulicity. Patient was admitted for DKA and started on insulin gtt. as per DKA protocol.  She was continued on IV hydration.  Anion gap closed,  insulin gtt. discontinued.  Diabetic coordinator consulted.  Hemoglobin A1c 7.2, reports nausea vomiting and diarrhea has resolved.  Patient feels better want to be discharged.  Patient is being discharged on metformin advised to follow-up with endocrinology.  Assessment and Plan: * DKA (diabetic ketoacidosis) (HCC)- (present on admission) Patient presented with nausea,  Vomiting,  diarrhea and elevated blood  glucose, low bicarb, high anion gap.  Started on DKA protocol. Anion gap closed.  She was transitioned to subcu insulin. Continued IV hydration, monitor serum electrolytes. Started on clear liquid diet, tolerating well. Started on Semglee 10 units.  Hemoglobin A1c 7.2 Diabetic coordinator consulted,  recommended patient can be discharged on metformin only.  Nausea, vomiting, diarrhea has resolved.  GERD (gastroesophageal reflux disease) Continue pantoprazole.  Anxiety Continue Lexapro.  HTN (hypertension), benign Continue atenolol.  Blood pressure has improved.  Diarrhea She reports diarrhea has improved.  Intractable nausea and vomiting Continue IV Zofran and Reglan as needed.    Consultants: None Procedures performed: None Disposition: Home Diet recommendation:  Discharge Diet Orders (From admission, onward)     Start     Ordered   07/31/21 0000  Diet - low sodium heart healthy        07/31/21 1016   07/31/21 0000  Diet Carb Modified        07/31/21 1016           Carb modified diet  DISCHARGE MEDICATION: Allergies as of 07/31/2021       Reactions   Augmentin [amoxicillin-pot Clavulanate] Rash   Dimetapp Night Cold-congestion [diphenhydramine-phenylephrine] Rash        Medication List     STOP taking these medications    albuterol (2.5 MG/3ML) 0.083% nebulizer solution Commonly known as: PROVENTIL   albuterol 108 (90 Base) MCG/ACT inhaler Commonly known as: VENTOLIN HFA   empagliflozin 25 MG Tabs tablet Commonly known as: JARDIANCE   fluconazole 150 MG tablet Commonly known as: DIFLUCAN   Trulicity 1.5 MG/0.5ML Sopn Generic drug: Dulaglutide  TAKE these medications    atenolol 50 MG tablet Commonly known as: TENORMIN Take 50 mg by mouth daily.   cetirizine 10 MG tablet Commonly known as: ZYRTEC Take 10 mg by mouth daily.   escitalopram 20 MG tablet Commonly known as: LEXAPRO Take 20 mg by mouth daily.   levothyroxine 175  MCG tablet Commonly known as: SYNTHROID Take 175 mcg by mouth daily before breakfast.   Melatonin 5 MG Caps Take 5 mg by mouth at bedtime as needed (for sleep).   metFORMIN 1000 MG tablet Commonly known as: GLUCOPHAGE Take 1,000 mg by mouth 2 (two) times daily with a meal.   metoCLOPramide 10 MG tablet Commonly known as: REGLAN Take 1 tablet (10 mg total) by mouth every 6 (six) hours.   ondansetron 4 MG disintegrating tablet Commonly known as: ZOFRAN-ODT Take 4 mg by mouth every 8 (eight) hours as needed for nausea or vomiting.   pantoprazole 40 MG tablet Commonly known as: PROTONIX Take 40 mg by mouth daily.   vitamin C 500 MG tablet Commonly known as: ASCORBIC ACID Take 500 mg by mouth daily.   Vitamin E 100 units Tabs Take 100 Units by mouth daily.        Follow-up Information     Reather Littler, MD Follow up in 1 week(s).   Specialty: Endocrinology Contact information: [address] [place] [address] [phone]                 Discharge Exam: Ceasar Mons Weights   07/29/21 2139  Weight: 124.7 kg   Physical Exam Vitals and nursing note reviewed.  Constitutional:      Appearance: She is well-developed. She is obese.  Cardiovascular:     Rate and Rhythm: Normal rate and regular rhythm.     Heart sounds: Normal heart sounds.  Pulmonary:     Effort: Pulmonary effort is normal.     Breath sounds: Normal breath sounds.  Abdominal:     General: Abdomen is flat. Bowel sounds are normal.     Palpations: Abdomen is soft.  Skin:    General: Skin is warm and dry.  Neurological:     Mental Status: She is alert.  Psychiatric:        Mood and Affect: Mood normal.        Behavior: Behavior normal.     Condition at discharge: stable  The results of significant diagnostics from this hospitalization (including imaging, microbiology, ancillary and laboratory) are listed below for reference.   Imaging Studies: DG CHEST PORT 1 VIEW  Result  Date: 07/29/2021 CLINICAL DATA:  N/v/d and abdominal pain for 2 days. EXAM: PORTABLE CHEST - 1 VIEW COMPARISON:  none FINDINGS: Lungs are clear.  Relatively low lung volumes. Heart size and mediastinal contours are within normal limits. No effusion. Visualized bones unremarkable. IMPRESSION: No acute cardiopulmonary disease. Electronically Signed   By: Corlis Leak M.D.   On: 07/29/2021 12:27   US Abdomen Limited RUQ (LIVER/GB)  Result Date: 07/29/2021 CLINICAL DATA:  Elevated LFTs EXAM: ULTRASOUND ABDOMEN LIMITED RIGHT UPPER QUADRANT COMPARISON:  None. FINDINGS: Gallbladder: Prior cholecystectomy. Common bile duct: Diameter: 4.6 mm Liver: Diffusely increased liver echogenicity. No focal lesion. Portal vein is patent on color Doppler imaging with normal direction of blood flow towards the liver. Other: None. IMPRESSION: Diffuse hepatic steatosis. Prior cholecystectomy.  No biliary ductal dilation. Electronically Signed   By: Caprice Renshaw M.D.   On: 07/29/2021 08:55    Microbiology: Results  for orders placed or performed during the hospital encounter of 07/29/21  Resp Panel by RT-PCR (Flu A&B, Covid) Nasopharyngeal Swab     Status: None   Collection Time: 07/29/21  5:06 AM   Specimen: Nasopharyngeal Swab; Nasopharyngeal(NP) swabs in vial transport medium  Result Value Ref Range Status   SARS Coronavirus 2 by RT PCR NEGATIVE NEGATIVE Final    Comment: (NOTE) SARS-CoV-2 target nucleic acids are NOT DETECTED.  The SARS-CoV-2 RNA is generally detectable in upper respiratory specimens during the acute phase of infection. The lowest concentration of SARS-CoV-2 viral copies this assay can detect is 138 copies/mL. A negative result does not preclude SARS-Cov-2 infection and should not be used as the sole basis for treatment or other patient management decisions. A negative result may occur with  improper specimen collection/handling, submission of specimen other than nasopharyngeal swab, presence of viral  mutation(s) within the areas targeted by this assay, and inadequate number of viral copies(<138 copies/mL). A negative result must be combined with clinical observations, patient history, and epidemiological information. The expected result is Negative.  Fact Sheet for Patients:  BloggerCourse.comhttps://www.fda.gov/media/152166/download  Fact Sheet for Healthcare Providers:  SeriousBroker.ithttps://www.fda.gov/media/152162/download  This test is no t yet approved or cleared by the [place] FDA and  has been authorized for detection and/or diagnosis of SARS-CoV-2 by FDA under an Emergency Use Authorization (EUA). This EUA will remain  in effect (meaning this test can be used) for the duration of the COVID-19 declaration under Section 564(b)(1) of the Act, 21 U.S.C.section 360bbb-3(b)(1), unless the authorization is terminated  or revoked sooner.       Influenza A by PCR NEGATIVE NEGATIVE Final   Influenza B by PCR NEGATIVE NEGATIVE Final    Comment: (NOTE) The Xpert Xpress SARS-CoV-2/FLU/RSV plus assay is intended as an aid in the diagnosis of influenza from Nasopharyngeal swab specimens and should not be used as a sole basis for treatment. Nasal washings and aspirates are unacceptable for Xpert Xpress SARS-CoV-2/FLU/RSV testing.  Fact Sheet for Patients: BloggerCourse.comhttps://www.fda.gov/media/152166/download  Fact Sheet for Healthcare Providers: SeriousBroker.ithttps://www.fda.gov/media/152162/download  This test is not yet approved or cleared by the [place] FDA and has been authorized for detection and/or diagnosis of SARS-CoV-2 by FDA under an Emergency Use Authorization (EUA). This EUA will remain in effect (meaning this test can be used) for the duration of the COVID-19 declaration under Section 564(b)(1) of the Act, 21 U.S.C. section 360bbb-3(b)(1), unless the authorization is terminated or revoked.  Performed at [facility], [address] [place], [place] [id number]     Labs: CBC: Recent  Labs  Lab 07/29/21 0506 07/30/21 0500  WBC 12.7* 10.4  HGB 16.5* 13.1  HCT 49.2* 39.1  MCV 91.3 90.5  PLT 315 232   Basic Metabolic Panel: Recent Labs  Lab 07/29/21 0506 07/29/21 1119 07/29/21 1745 07/30/21 0500 07/31/21 0651  NA 135 136 135 131* 135  K 4.0 3.7 3.8 3.7 3.5  CL 103 105 105 102 105  CO2 15* 17* 20* 18* 23  GLUCOSE 197* 127* 147* 109* 119*  BUN 32* 25* 20 12 9   CREATININE 0.94 0.76 0.70 0.59 0.54  CALCIUM 9.2 8.5* 8.4* 8.5* 8.5*  PHOS  --   --  2.5  --   --    Liver Function Tests: Recent Labs  Lab 07/29/21 0506 07/29/21 1745  AST 51*  --   ALT 83*  --   ALKPHOS 61  --   BILITOT 1.4*  --   PROT  9.0*  --   ALBUMIN 4.8 4.1   CBG: Recent Labs  Lab 07/30/21 0854 07/30/21 1145 07/30/21 1741 07/30/21 2109 07/31/21 0810  GLUCAP 101* 149* 117* 158* 127*    Discharge time spent: greater than 30 minutes.  Signed: Cipriano Bunker, MD [facility] 07/31/2021

## 2021-08-20 ENCOUNTER — Other Ambulatory Visit (HOSPITAL_COMMUNITY)
Admission: RE | Admit: 2021-08-20 | Discharge: 2021-08-20 | Disposition: A | Discharge status: Home / Self Care | Payer: 59 | Source: Ambulatory Visit | Attending: Obstetrics & Gynecology | Admitting: Obstetrics & Gynecology

## 2021-08-20 ENCOUNTER — Encounter: Payer: Self-pay | Admitting: Obstetrics & Gynecology

## 2021-08-20 ENCOUNTER — Ambulatory Visit: Payer: 59 | Admitting: Obstetrics & Gynecology

## 2021-08-20 ENCOUNTER — Other Ambulatory Visit: Payer: Self-pay

## 2021-08-20 VITALS — BP 120/85 | HR 87 | Ht 67.0 in | Wt 259.9 lb

## 2021-08-20 DIAGNOSIS — Z113 Encounter for screening for infections with a predominantly sexual mode of transmission: Secondary | ICD-10-CM | POA: Insufficient documentation

## 2021-08-20 DIAGNOSIS — Z8742 Personal history of other diseases of the female genital tract: Secondary | ICD-10-CM

## 2021-08-20 DIAGNOSIS — Z8619 Personal history of other infectious and parasitic diseases: Secondary | ICD-10-CM

## 2021-08-20 NOTE — Progress Notes (Signed)
? ?  GYNECOLOGY OFFICE VISIT NOTE ? ?History:  ? Janet Costa is a 31 y.o. G0P0000 here today for repeat testing for history of positive testing for trichomonal, candidal and bacterial vaginitis on 06/24/21.  Had negative HIV test on 07/31/2021 and wants other STI screening. She denies any abnormal vaginal discharge, bleeding, pelvic pain or other concerns.  ?  ?Past Medical History:  ?Diagnosis Date  ? Asthma   ? Diabetes mellitus without complication ([place])   ? Hypertension   ? Hypothyroid   ? Mental disorder   ? PCOS (polycystic ovarian syndrome)   ? ? ?Past Surgical History:  ?Procedure Laterality Date  ? CHOLECYSTECTOMY  2016  ? WISDOM TOOTH EXTRACTION    ? ? ?The following portions of the patient's history were reviewed and updated as appropriate: allergies, current medications, past family history, past medical history, past social history, past surgical history and problem list.  ? ?Health Maintenance:  Normal pap and negative HRHPV on 06/24/2021. ? ?Review of Systems:  ?Pertinent items noted in HPI and remainder of comprehensive ROS otherwise negative. ? ?Physical Exam:  ?BP 120/85   Pulse 87   Ht 5\' 7"  (1.702 m)   Wt 259 lb 14.4 oz (117.9 kg)   BMI 40.71 kg/m?  ?CONSTITUTIONAL: Well-developed, well-nourished female in no acute distress.  ?HEENT:  Normocephalic, atraumatic. External right and left ear normal. No scleral icterus.  ?NECK: Normal range of motion, supple, no masses noted on observation ?SKIN: No rash noted. Not diaphoretic. No erythema. No pallor. ?MUSCULOSKELETAL: Normal range of motion. No edema noted. ?NEUROLOGIC: Alert and oriented to person, place, and time. Normal muscle tone coordination. No cranial nerve deficit noted. ?PSYCHIATRIC: Normal mood and affect. Normal behavior. Normal judgment and thought content. ?CARDIOVASCULAR: Normal heart rate noted ?RESPIRATORY: Effort and breath sounds normal, no problems with respiration noted ?ABDOMEN: No masses noted. No other overt  distention noted.   ?PELVIC: Normal appearing external genitalia; normal urethral meatus; normal appearing distal vaginal mucosa.  No abnormal discharge noted, testing sample obtained.  Performed in the presence of a chaperone ?    ?Assessment and Plan:  ?   ?1. History of trichomonal vaginitis ?2. History of bacterial and candidal vaginitis ?3. Routine screening for STI (sexually transmitted infection) ?Testing done, will follow up results and manage accordingly. ?Safe sex practices recommended at all times. ?Proper vulvar hygiene emphasized: discussed avoidance of perfumed soaps, detergents, lotions and any type of douches; in addition to wearing cotton underwear and no underwear at night.  Also recommended cleaning front to back, voiding and cleaning up after intercourse.  ?- Cervicovaginal ancillary only( [place]) ?- Hepatitis B surface antigen ?- RPR ?- Hepatitis C antibody ?Routine preventative health maintenance measures emphasized. ?Please refer to After Visit Summary for other counseling recommendations.  ? ?Return for any gynecologic concerns.   ? ?I spent 20 minutes dedicated to the care of this patient including pre-visit review of records, face to face time with the patient discussing her conditions and treatments and post visit orders. ? ? ? ?Verita Schneiders, MD, FACOG ?Obstetrician Social research officer, government, Faculty Practice ?[facility] ?

## 2021-08-21 LAB — HEPATITIS C ANTIBODY: Hep C Virus Ab: NONREACTIVE

## 2021-08-21 LAB — RPR: RPR Ser Ql: NONREACTIVE

## 2021-08-21 LAB — HEPATITIS B SURFACE ANTIGEN: Hepatitis B Surface Ag: NEGATIVE

## 2021-08-23 LAB — CERVICOVAGINAL ANCILLARY ONLY
Bacterial Vaginitis (gardnerella): NEGATIVE
Candida Glabrata: NEGATIVE
Candida Vaginitis: NEGATIVE
Chlamydia: NEGATIVE
Comment: NEGATIVE
Comment: NEGATIVE
Comment: NEGATIVE
Comment: NEGATIVE
Comment: NEGATIVE
Comment: NORMAL
Neisseria Gonorrhea: NEGATIVE
Trichomonas: NEGATIVE

## 2021-08-26 ENCOUNTER — Telehealth: Payer: 59 | Admitting: Obstetrics and Gynecology

## 2022-04-08 IMAGING — DX DG CHEST 1V PORT
1 series · 1 of 1 positions shown · non-contrast
Comparison: none

CLINICAL DATA: N/v/d and abdominal pain for 2 days.

EXAM:
PORTABLE CHEST - 1 VIEW

[chest ap]
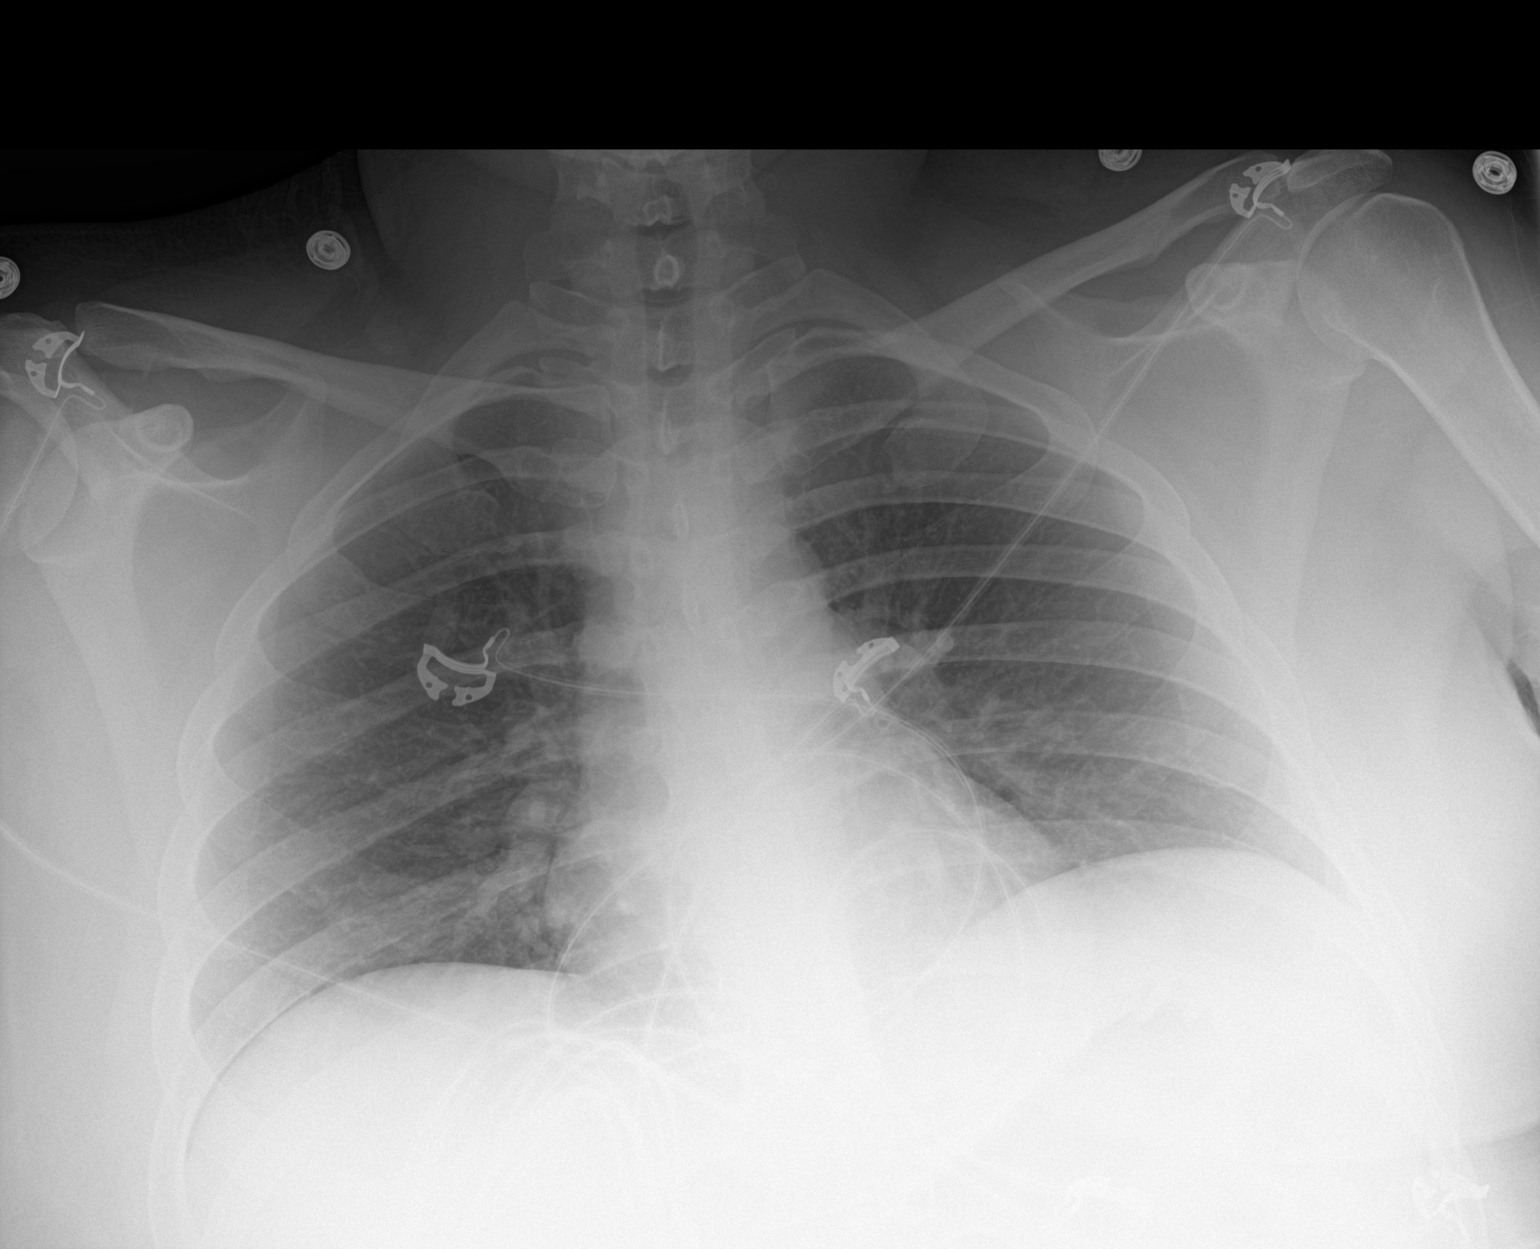

[1 of 1 positions shown; findings below may reference images not displayed]

FINDINGS: Lungs are clear.  Relatively low lung volumes.

Heart size and mediastinal contours are within normal limits.

No effusion.

Visualized bones unremarkable.
IMPRESSION: No acute cardiopulmonary disease.

## 2022-04-08 IMAGING — US US ABDOMEN LIMITED
1 series · 14 of 25 positions shown · non-contrast
Comparison: None.

CLINICAL DATA: Elevated LFTs

EXAM:
ULTRASOUND ABDOMEN LIMITED RIGHT UPPER QUADRANT

[Series 1: us abdomen limited · 14 of 48 slices shown]
[im 1/48]
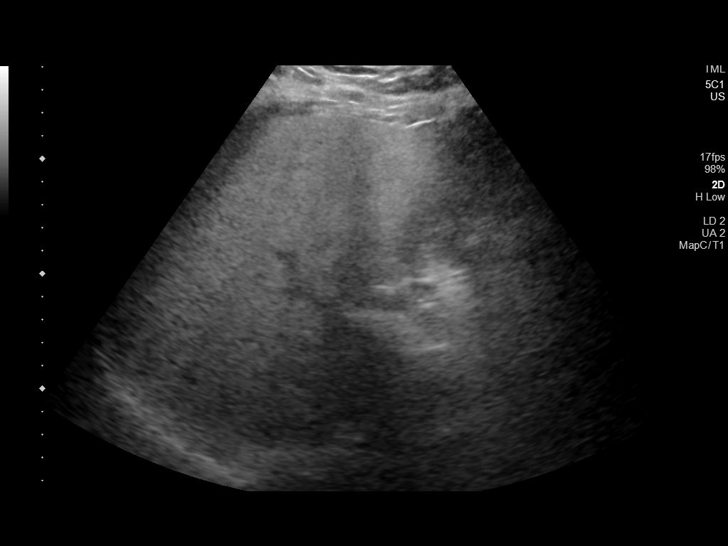
[im 4/48]
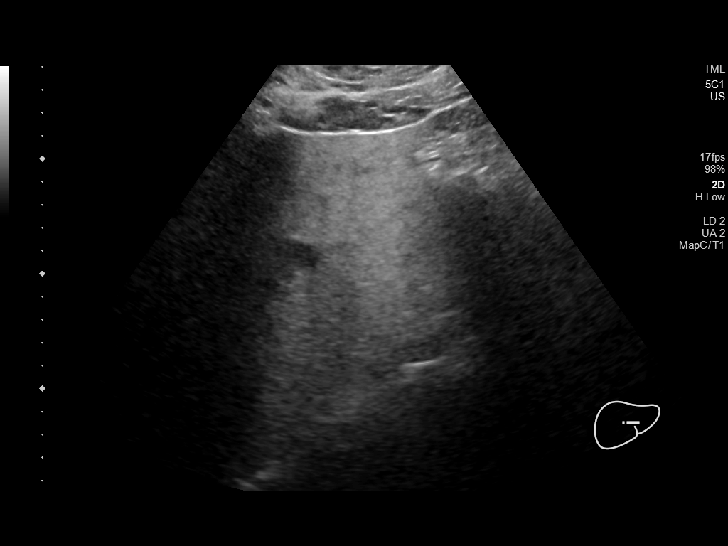
[im 8/48]
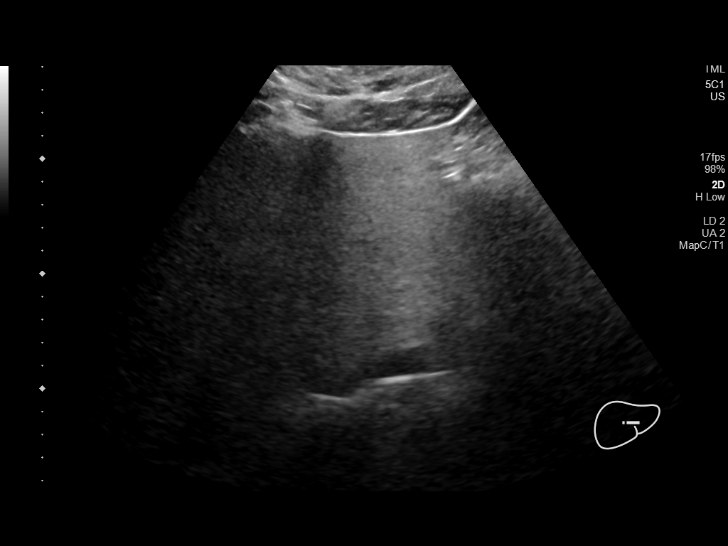
[im 12/48]
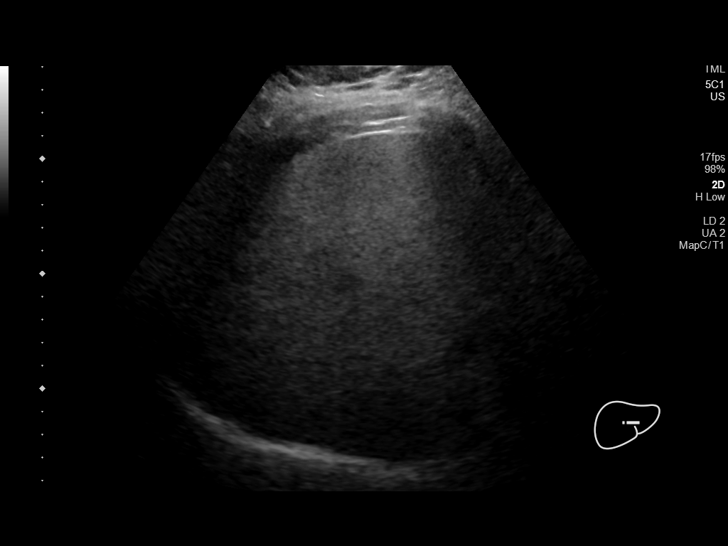
[im 16/48]
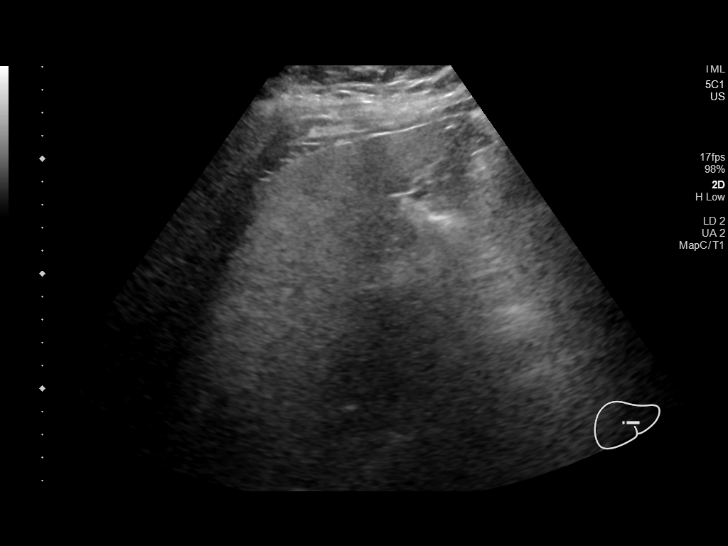
[im 18/48]
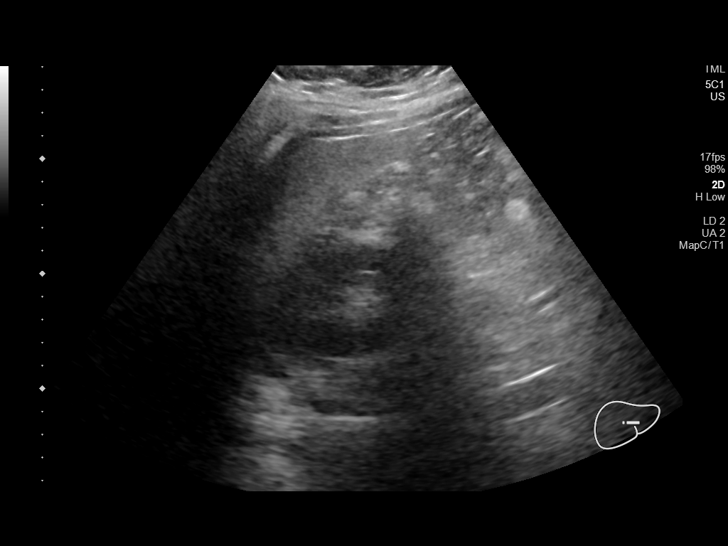
[im 22/48]
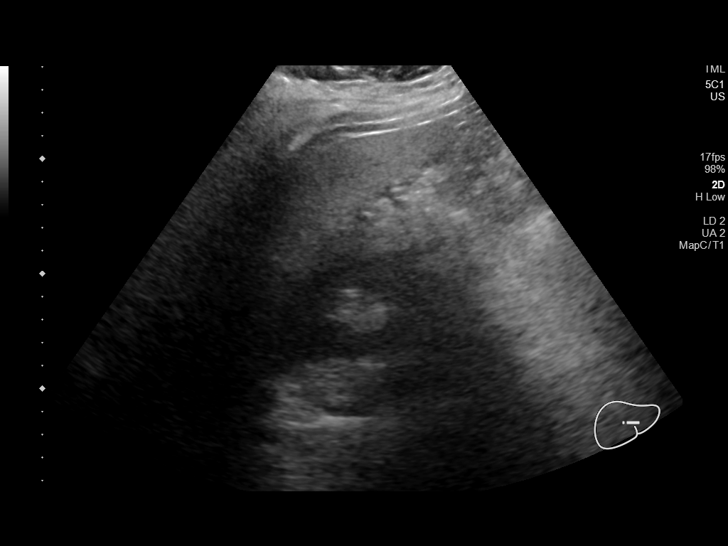
[im 26/48]
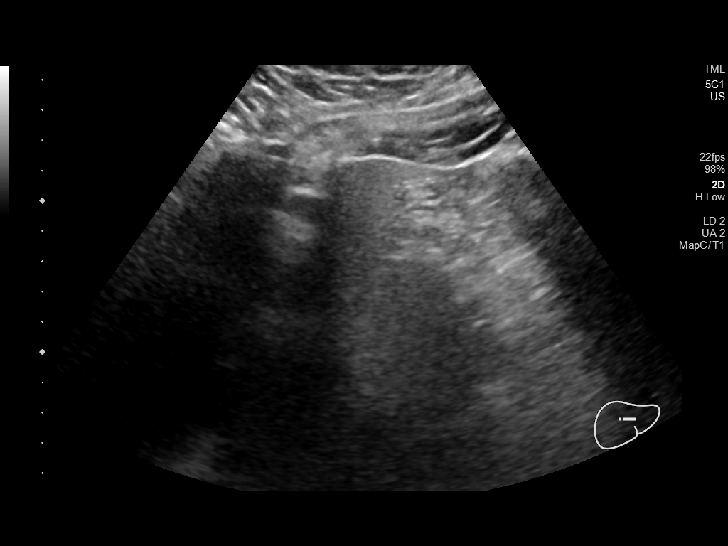
[im 30/48]
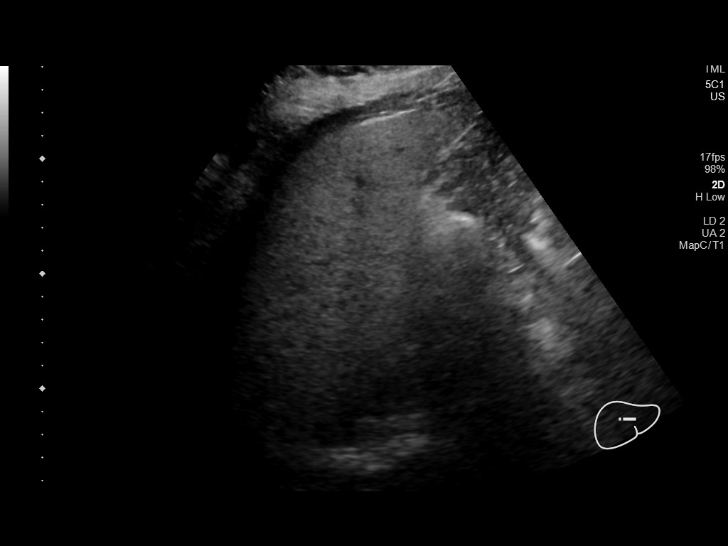
[im 32/48]
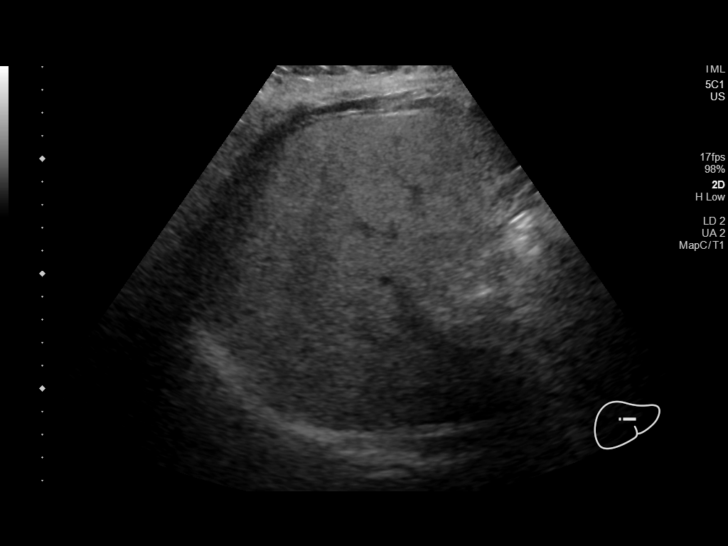
[im 36/48]
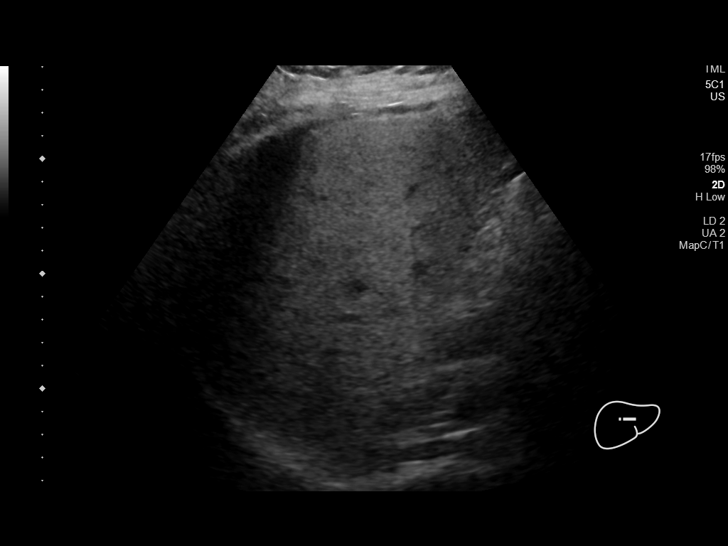
[im 40/48]
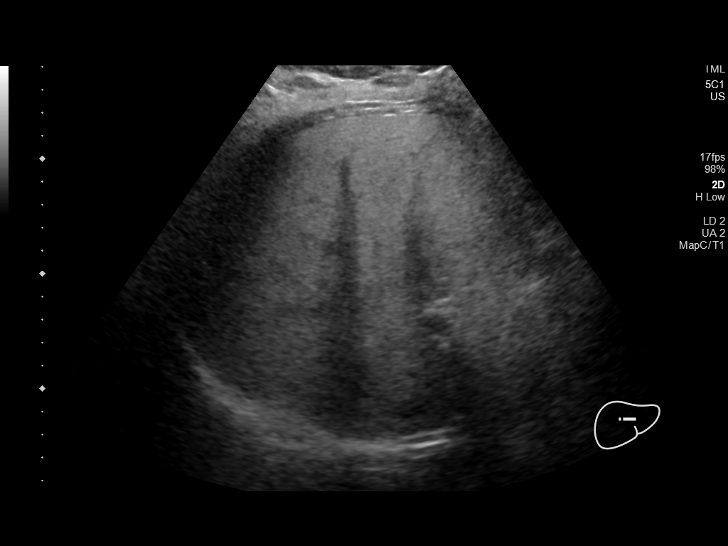
[im 44/48]
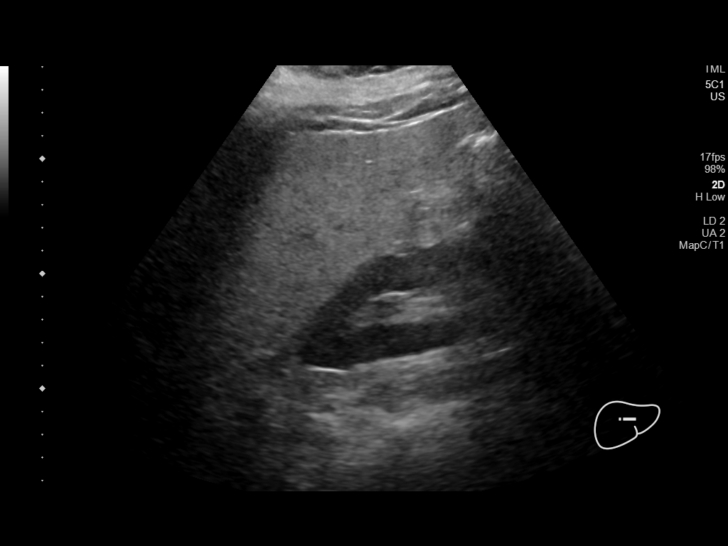
[im 48/48]
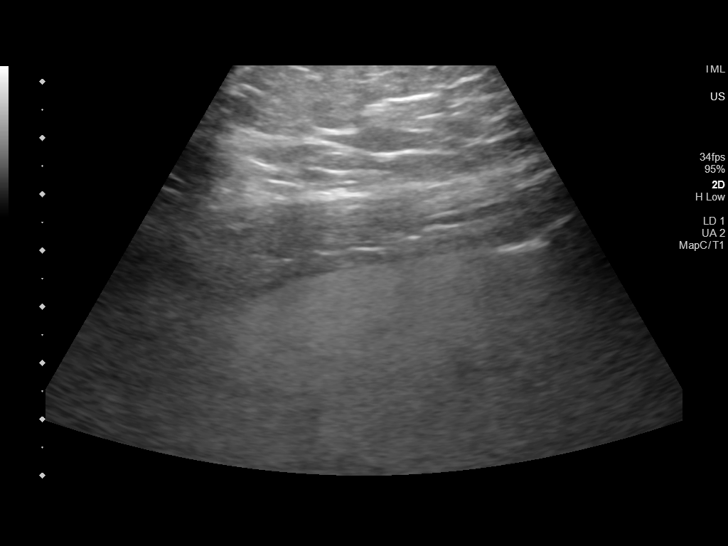

[14 of 25 positions shown; findings below may reference images not displayed]

FINDINGS: Gallbladder:

Prior cholecystectomy.

Common bile duct:

Diameter: 4.6 mm

Liver:

Diffusely increased liver echogenicity. No focal lesion. Portal vein
is patent on color Doppler imaging with normal direction of blood
flow towards the liver.

Other: None.
IMPRESSION: Diffuse hepatic steatosis.

Prior cholecystectomy.  No biliary ductal dilation.
